# Patient Record
Sex: Female | Born: 1960
Health system: Southern US, Community
[De-identification: ages and names within clinical notes are randomized; demographics above are authoritative.]

## PROBLEM LIST (undated history)

## (undated) DIAGNOSIS — K900 Celiac disease: Secondary | ICD-10-CM

## (undated) DIAGNOSIS — E039 Hypothyroidism, unspecified: Secondary | ICD-10-CM

## (undated) DIAGNOSIS — D649 Anemia, unspecified: Secondary | ICD-10-CM

## (undated) DIAGNOSIS — E079 Disorder of thyroid, unspecified: Secondary | ICD-10-CM

## (undated) DIAGNOSIS — R011 Cardiac murmur, unspecified: Secondary | ICD-10-CM

## (undated) DIAGNOSIS — Z8489 Family history of other specified conditions: Secondary | ICD-10-CM

## (undated) HISTORY — PX: TUBAL LIGATION: SHX77

## (undated) HISTORY — PX: APPENDECTOMY: SHX54

## (undated) HISTORY — DX: Cardiac murmur, unspecified: R01.1

## (undated) HISTORY — DX: Celiac disease: K90.0

## (undated) HISTORY — DX: Disorder of thyroid, unspecified: E07.9

## (undated) HISTORY — PX: HERNIA REPAIR: SHX51

---

## 1998-09-24 ENCOUNTER — Ambulatory Visit (HOSPITAL_BASED_OUTPATIENT_CLINIC_OR_DEPARTMENT_OTHER): Admission: RE | Admit: 1998-09-24 | Discharge: 1998-09-24 | Payer: Self-pay | Admitting: Surgery

## 1999-01-27 ENCOUNTER — Ambulatory Visit (HOSPITAL_COMMUNITY): Admission: RE | Admit: 1999-01-27 | Discharge: 1999-01-27 | Payer: Self-pay | Admitting: Emergency Medicine

## 1999-01-27 ENCOUNTER — Encounter: Payer: Self-pay | Admitting: Emergency Medicine

## 2000-06-13 ENCOUNTER — Ambulatory Visit (HOSPITAL_COMMUNITY): Admission: RE | Admit: 2000-06-13 | Discharge: 2000-06-13 | Payer: Self-pay | Admitting: Emergency Medicine

## 2000-06-13 ENCOUNTER — Encounter: Payer: Self-pay | Admitting: Emergency Medicine

## 2000-08-23 ENCOUNTER — Ambulatory Visit (HOSPITAL_COMMUNITY): Admission: RE | Admit: 2000-08-23 | Discharge: 2000-08-23 | Payer: Self-pay | Admitting: Gastroenterology

## 2001-03-13 ENCOUNTER — Encounter: Payer: Self-pay | Admitting: Emergency Medicine

## 2001-03-13 ENCOUNTER — Emergency Department (HOSPITAL_COMMUNITY): Admission: EM | Admit: 2001-03-13 | Discharge: 2001-03-13 | Payer: Self-pay | Admitting: Emergency Medicine

## 2001-06-19 ENCOUNTER — Ambulatory Visit (HOSPITAL_COMMUNITY): Admission: RE | Admit: 2001-06-19 | Discharge: 2001-06-19 | Payer: Self-pay | Admitting: Emergency Medicine

## 2001-06-19 ENCOUNTER — Encounter: Payer: Self-pay | Admitting: Emergency Medicine

## 2002-06-26 ENCOUNTER — Encounter: Payer: Self-pay | Admitting: Emergency Medicine

## 2002-06-26 ENCOUNTER — Ambulatory Visit (HOSPITAL_COMMUNITY): Admission: RE | Admit: 2002-06-26 | Discharge: 2002-06-26 | Payer: Self-pay | Admitting: Emergency Medicine

## 2002-08-15 ENCOUNTER — Other Ambulatory Visit: Admission: RE | Admit: 2002-08-15 | Discharge: 2002-08-15 | Payer: Self-pay | Admitting: Obstetrics and Gynecology

## 2003-07-04 ENCOUNTER — Ambulatory Visit (HOSPITAL_COMMUNITY): Admission: RE | Admit: 2003-07-04 | Discharge: 2003-07-04 | Payer: Self-pay | Admitting: Obstetrics and Gynecology

## 2004-08-13 ENCOUNTER — Ambulatory Visit (HOSPITAL_COMMUNITY): Admission: RE | Admit: 2004-08-13 | Discharge: 2004-08-13 | Payer: Self-pay | Admitting: Family Medicine

## 2007-01-05 ENCOUNTER — Ambulatory Visit (HOSPITAL_COMMUNITY): Admission: RE | Admit: 2007-01-05 | Discharge: 2007-01-05 | Payer: Self-pay | Admitting: Family Medicine

## 2008-01-28 ENCOUNTER — Ambulatory Visit: Payer: Self-pay | Admitting: Sports Medicine

## 2008-01-28 DIAGNOSIS — M25569 Pain in unspecified knee: Secondary | ICD-10-CM | POA: Insufficient documentation

## 2008-02-28 ENCOUNTER — Ambulatory Visit (HOSPITAL_COMMUNITY): Admission: RE | Admit: 2008-02-28 | Discharge: 2008-02-28 | Payer: Self-pay | Admitting: Family Medicine

## 2008-03-13 ENCOUNTER — Ambulatory Visit: Payer: Self-pay | Admitting: Sports Medicine

## 2008-04-11 ENCOUNTER — Telehealth: Payer: Self-pay | Admitting: Sports Medicine

## 2008-12-30 ENCOUNTER — Ambulatory Visit: Payer: Self-pay | Admitting: Sports Medicine

## 2008-12-30 DIAGNOSIS — M25559 Pain in unspecified hip: Secondary | ICD-10-CM | POA: Insufficient documentation

## 2008-12-30 DIAGNOSIS — S76311A Strain of muscle, fascia and tendon of the posterior muscle group at thigh level, right thigh, initial encounter: Secondary | ICD-10-CM | POA: Insufficient documentation

## 2009-02-10 ENCOUNTER — Ambulatory Visit: Payer: Self-pay | Admitting: Sports Medicine

## 2009-02-10 DIAGNOSIS — R269 Unspecified abnormalities of gait and mobility: Secondary | ICD-10-CM | POA: Insufficient documentation

## 2010-05-25 NOTE — Assessment & Plan Note (Signed)
Summary: FU VISIT/MJD   Vital Signs:  Patient profile:   50 year old female BP sitting:   90 / 60  Vitals Entered By: Lillia Pauls CMA (February 10, 2009 9:28 AM)  History of Present Illness: Patient presents for follow-up of left hamstring pull, proximally. Overall, she is 80% better and is able to tolerate jogging 3-4 times per week. She is now running about 30-35 miles per week. She feels tight at the beginning of her run but then loosens up and feels better. She is wearing the hamstring sleeve but it has recently started falling down since the weather has gotten colder. If she sweats more, it stays in place. She is also wearing the [place] strap on the left which has been helpful. She is doing the home exercises comfortably and feels as if they are helping her strength.   Physical Exam  General:  alert, well-developed, and well-nourished.   Head:  normocephalic and atraumatic.   Msk:  Left Thigh: Normal inspection without bony abnormalities, edema or bruising. She has full ROM of her left hip with internal and external rotation.  She has full ROM of her left knee. No TTP throughout her lateral left hip, groin or anterior thigh. + TTP at proximal mid hamstring with deep palpation only She has pain with resisted knee flexion in the prone position at 90 degrees and more so at 170 degrees which causes some pain. 5/5 strength with resisted hip flexion and abduction. 5/5 strength with resisted knee flexion and extension.  Right Thigh: Normal inspection without bony abnormalities, edema or bruising. She has full ROM of her left hip with internal and external rotation.  She has full ROM of her right knee. No TTP throughout her lateral right hip, groin or anterior or posterior thigh. No pain with resisted knee flexion in the prone position at 90 degrees or at 170 degrees. 5/5 strength with resisted hip flexion and abduction. 5/5 strength with resisted knee flexion and extension.  She still  has an antalgic gait with stiffness but this improves the more that she jogs in the office. She has medial left knee wobble with jogging.    Impression & Recommendations:  Problem # 1:  MUSCLE STRAIN, HAMSTRING MUSCLE (ICD-844.8) Assessment Improved Improving slowly. Reassured her that this will continue to improve with time. 1. Continue home exercise program but may gradually decrease exercises to 3 times per week. 2. Given scaphoid pad in her left shoe to prevent the knee wobble noticed during her gait analysis. 3. Will get the patient a small omniskin hamstring sleeve that causes less sweating for her to try. 4. Follow-up as needed. 5. Work on form drills.  Problem # 2:  ABNORMALITY OF GAIT (ICD-781.2) Assessment: New Still having an antalgic gait from her hamstring with left knee wobble. 1. Placed in a small scaphoid pad on the left to decrease medial knee wobble. 2. Continue exercises for maintenance.  3. Form drills to improve gait.   Patient Instructions: 1)  Continue your hamstring exercises at least 3 times per day. 2)  Work on [employer] of your gait. You still have a bit of a wobble of your inner knee but you should get better with form drills and by wearing the arch support in your left leg. Form drills 2 times per week : 10 x 75 yards. 3)  No speed drills until your form has improved and you feel smooth. 4)  Avoid going down hills because of overstriding. 5)  Please contact us next week about the new hamstring sleeve. 6)  You had a small scaphoid pad placed in your left shoe.   Appended Document: FU VISIT/MJD

## 2010-05-25 NOTE — Progress Notes (Signed)
Summary: knee pain  Phone Note Call from Patient Call back at [phone]   Caller: Patient Summary of Call: Patient says she has been experiencing some pain in her knee.  Wants to start training but not sure if she should.  Would like return call to discuss whether or not she needs to come back in to see Dr. Darrick Penna. Initial call taken by: Levada Schilling,  April 11, 2008 9:19 AM    I called and we will modify and grad increase training

## 2010-05-25 NOTE — Assessment & Plan Note (Signed)
Summary: L HAMSTRING INJURY X JUNE   Vital Signs:  Patient profile:   50 year old female Height:      66 inches Weight:      125 pounds BMI:     20.25 BP sitting:   110 / 60  Vitals Entered By: Lillia Pauls CMA (December 30, 2008 10:40 AM)  History of Present Illness: The patient is a 50 yo WF who presents for left hamstring pain. She is an avid runner who is currently training for the [employer]. 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 She states that back in June she ran about 5, 800s with her son who is 18 at the track. She does not remember a specific injury or pop but knows that she increased her intensity on her last sprint. Ever since that day, she has noticed an aching pain in the mid posterior thigh that radiates up towards her buttocks. She typically describes an aching sensation that gets better after running about 2 miles. However, the pain is exacerbated by sitting for long periods of time or running down hills. She is now avoiding running down hills but admits that the [employer] on 01/25/2009 has many hills. She would like to be ready for this race if possible but is more concerned about [place]. She has tried icing and ibuprofen with minimal improvement. She usually runs about 45 miles per week but is down to 30 miles because of her injury.   Physical Exam  General:  alert, well-developed, well-nourished, and well-hydrated.   Head:  normocephalic and atraumatic.   Msk:  Inspection of bilateral legs shows no bony abnormalities, bruising or edema. She has TTP in the mid posterior thigh along her semitendinous but not at the ischial tuberosity.  Fluid is mild around this muscle. No gap or tears are appreciated on palpation.  She has pain with resisted knee flexion at the semitendinous at 30 degrees and less pain at 90 degrees.   Normal internal and external rotation of bilateral hips. Good left hip abductor strength but weaker right hip abductor. Normal  inspection, palpation and ROM of bilateral knees.  Gait is antalgic with bilateral intoeing, left greater than right. She has horizontal motion in bilateral knees.   Impression & Recommendations:  Problem # 1:  MUSCLE STRAIN, HAMSTRING MUSCLE (ICD-844.8) Assessment New  Injury occurred on or about June 10th, 2010.   Wear thigh sleeve regularly during activity. Ice thigh for 20 minutes after activity. Avoid downhill runs. Start hamstring rehab exercises, phase 1. Do these at least daily.  Modify running so that she does not run to the point of hurting. It is unlikely that she will be ready for the marathon 01/25/2009 but she can make that judgement based on her symptoms.  Problem # 2:  PAIN IN JOINT PELVIC REGION AND THIGH (ICD-719.45) Assessment: New  See above. Can take an OTC NSAID of choice as needed for pain. Regular icing and activity modification.

## 2010-06-22 ENCOUNTER — Other Ambulatory Visit (HOSPITAL_COMMUNITY): Payer: Self-pay | Admitting: Family Medicine

## 2010-06-22 DIAGNOSIS — Z1231 Encounter for screening mammogram for malignant neoplasm of breast: Secondary | ICD-10-CM

## 2010-06-30 ENCOUNTER — Ambulatory Visit (HOSPITAL_COMMUNITY)
Admission: RE | Admit: 2010-06-30 | Discharge: 2010-06-30 | Disposition: A | Discharge status: Home / Self Care | Payer: 59 | Source: Ambulatory Visit | Attending: Family Medicine | Admitting: Family Medicine

## 2010-06-30 DIAGNOSIS — Z1231 Encounter for screening mammogram for malignant neoplasm of breast: Secondary | ICD-10-CM | POA: Insufficient documentation

## 2010-09-10 NOTE — Op Note (Signed)
Colleen Orr. [facility]  Patient:    Colleen Orr, Colleen Orr                    MRN: [id number] Proc. Date: 08/23/00 Adm. Date:  [id number] Attending:  Charna Elizabeth CC:         [name], MD   Operative Report  DATE OF BIRTH:  Oct 15, 1960.  REFERRING PHYSICIAN:  Dr. Lesle Chris.  PROCEDURE PERFORMED:  Colonoscopy.  ENDOSCOPIST:  Anselmo Rod, M.D.  INSTRUMENT USED:  Olympus video colonoscope.  INDICATIONS FOR PROCEDURE:  Rectal bleeding in a 50 year old white female with a history of diarrhea in the recent past, rule out colonic polyps, masses, hemorrhoids, etc.  PREPROCEDURE PREPARATION:  Informed consent was procured from the patient. The patient was fasted for eight hours prior to the procedure and prepped with a bottle of magnesium citrate and a gallon of NuLytely the night prior to the procedure.  PREPROCEDURE PHYSICAL:  The patient had stable vital signs.  Neck supple. Chest clear to auscultation.  S1, S2 regular.  No murmurs, gallops or rubs, rales, rhonchi or wheezing.  Abdomen soft with normal abdominal bowel sounds.  DESCRIPTION OF PROCEDURE:  The patient was placed in the left lateral decubitus position and sedated with 75 mg of Demerol and 7.5 mg of Versed intravenously.  Once the patient was adequately sedated and maintained on low-flow oxygen and continuous cardiac monitoring, the Olympus video colonoscope was advanced from the rectum to the cecum without difficulty. Except for small internal hemorrhoids, no other abnormalities were noted.  No masses, polyps, erosions, ulcerations were present.  IMPRESSION:  Normal colonoscopy except for small internal hemorrhoids.  RECOMMENDATIONS: 1. The patient has been advised to follow up in the office in the next    two weeks. 2. A high fiber diet and liberal fluid intake has been advocated to help with    her irritable bowel syndrome-like symptoms. 3. Repeat hepatitis serologies will be  done and a work up continued on an    outpatient basis. DD:  08/23/00 TD:  08/23/00 Job: [id number]

## 2011-07-25 ENCOUNTER — Other Ambulatory Visit: Payer: Self-pay | Admitting: Family Medicine

## 2011-07-25 DIAGNOSIS — Z1231 Encounter for screening mammogram for malignant neoplasm of breast: Secondary | ICD-10-CM

## 2011-08-05 ENCOUNTER — Ambulatory Visit (HOSPITAL_COMMUNITY)
Admission: RE | Admit: 2011-08-05 | Discharge: 2011-08-05 | Disposition: A | Discharge status: Home / Self Care | Payer: 59 | Source: Ambulatory Visit | Attending: Family Medicine | Admitting: Family Medicine

## 2011-08-05 DIAGNOSIS — Z1231 Encounter for screening mammogram for malignant neoplasm of breast: Secondary | ICD-10-CM

## 2012-08-14 ENCOUNTER — Other Ambulatory Visit (HOSPITAL_COMMUNITY): Payer: Self-pay | Admitting: Family Medicine

## 2012-08-14 DIAGNOSIS — Z1231 Encounter for screening mammogram for malignant neoplasm of breast: Secondary | ICD-10-CM

## 2012-08-16 ENCOUNTER — Ambulatory Visit (HOSPITAL_COMMUNITY)
Admission: RE | Admit: 2012-08-16 | Discharge: 2012-08-16 | Disposition: A | Discharge status: Home / Self Care | Payer: 59 | Source: Ambulatory Visit | Attending: Family Medicine | Admitting: Family Medicine

## 2012-08-16 DIAGNOSIS — Z1231 Encounter for screening mammogram for malignant neoplasm of breast: Secondary | ICD-10-CM | POA: Insufficient documentation

## 2012-11-15 ENCOUNTER — Other Ambulatory Visit (HOSPITAL_BASED_OUTPATIENT_CLINIC_OR_DEPARTMENT_OTHER): Payer: Self-pay | Admitting: Family Medicine

## 2012-11-15 DIAGNOSIS — E039 Hypothyroidism, unspecified: Secondary | ICD-10-CM

## 2012-11-19 ENCOUNTER — Ambulatory Visit (HOSPITAL_BASED_OUTPATIENT_CLINIC_OR_DEPARTMENT_OTHER): Payer: 59

## 2012-11-20 ENCOUNTER — Ambulatory Visit (HOSPITAL_BASED_OUTPATIENT_CLINIC_OR_DEPARTMENT_OTHER)
Admission: RE | Admit: 2012-11-20 | Discharge: 2012-11-20 | Disposition: A | Discharge status: Home / Self Care | Payer: 59 | Source: Ambulatory Visit | Attending: Family Medicine | Admitting: Family Medicine

## 2012-11-20 DIAGNOSIS — E039 Hypothyroidism, unspecified: Secondary | ICD-10-CM | POA: Insufficient documentation

## 2012-11-20 DIAGNOSIS — E041 Nontoxic single thyroid nodule: Secondary | ICD-10-CM | POA: Insufficient documentation

## 2013-09-11 ENCOUNTER — Other Ambulatory Visit (HOSPITAL_COMMUNITY): Payer: Self-pay | Admitting: Family Medicine

## 2013-09-11 DIAGNOSIS — Z1231 Encounter for screening mammogram for malignant neoplasm of breast: Secondary | ICD-10-CM

## 2013-09-23 ENCOUNTER — Ambulatory Visit (HOSPITAL_COMMUNITY)
Admission: RE | Admit: 2013-09-23 | Discharge: 2013-09-23 | Disposition: A | Discharge status: Home / Self Care | Payer: 59 | Source: Ambulatory Visit | Attending: Family Medicine | Admitting: Family Medicine

## 2013-09-23 DIAGNOSIS — Z1231 Encounter for screening mammogram for malignant neoplasm of breast: Secondary | ICD-10-CM | POA: Insufficient documentation

## 2014-01-08 ENCOUNTER — Other Ambulatory Visit (HOSPITAL_COMMUNITY)
Admission: RE | Admit: 2014-01-08 | Discharge: 2014-01-08 | Disposition: A | Discharge status: Home / Self Care | Payer: 59 | Source: Ambulatory Visit | Attending: Family Medicine | Admitting: Family Medicine

## 2014-01-08 ENCOUNTER — Other Ambulatory Visit: Payer: Self-pay

## 2014-01-08 DIAGNOSIS — Z124 Encounter for screening for malignant neoplasm of cervix: Secondary | ICD-10-CM | POA: Diagnosis not present

## 2014-01-13 LAB — CYTOLOGY - PAP

## 2014-02-05 ENCOUNTER — Ambulatory Visit (INDEPENDENT_AMBULATORY_CARE_PROVIDER_SITE_OTHER): Payer: 59 | Admitting: Family Medicine

## 2014-02-05 ENCOUNTER — Encounter: Payer: Self-pay | Admitting: Family Medicine

## 2014-02-05 VITALS — BP 134/79 | HR 56 | Ht 66.0 in | Wt 130.0 lb

## 2014-02-05 DIAGNOSIS — M25552 Pain in left hip: Secondary | ICD-10-CM

## 2014-02-05 NOTE — Patient Instructions (Signed)
You have a hip flexor strain and/or snapping hip syndrome. These conditions go hand in hand and treatment is the same. Activities as tolerated - ok to run as long as not limping and pain is less than a 3 on a scale of 1-10. Ibuprofen or aleve if needed for pain. Strengthening and stretching are very important. Straight leg raises, standing hip rotations 3 sets of 10 once a day.   Stretches - hold for 20-30 seconds, repeat 3 times. Can do additional exercises and stretches in the handout as well. Consider physical therapy if not improving as expected. I would expect 4-6 weeks for this to feel completely better. Heat 15 minutes at a time 3-4 times a day with icing 15 minutes after you work out. Follow up with me in 5-6 weeks if not improved.

## 2014-02-07 ENCOUNTER — Encounter: Payer: Self-pay | Admitting: Family Medicine

## 2014-02-07 DIAGNOSIS — M25552 Pain in left hip: Secondary | ICD-10-CM | POA: Insufficient documentation

## 2014-02-07 NOTE — Progress Notes (Signed)
Patient ID: Colleen HarvestPhyllis A Orr, female   DOB: 1960/06/16, 53 y.o.   MRN: [id number]  PCP: Colleen RuaMEYERS, STEPHEN, MD  Subjective:   HPI: Patient is a 53 y.o. female here for left hip pain.  Patient reports she started getting left hip pain about 2 weeks ago. Has progressed since that time. Was better with rest. Worse after she used treadmill 4 days in a row. No injury or trauma. Hip sore at end of 6 mile run this morning. No swelling or bruising. Achy pain. Tried icing, motrin. Worse with hip flexion.  Past Medical History  Diagnosis Date  . Celiac disease   . Thyroid disease     No current outpatient prescriptions on file prior to visit.   No current facility-administered medications on file prior to visit.    Past Surgical History  Procedure Laterality Date  . Hernia repair    . Tubal ligation    . Appendectomy      No Known Allergies  History   Social History  . Marital Status: Married    Spouse Name: N/A    Number of Children: N/A  . Years of Education: N/A   Occupational History  . Not on file.   Social History Main Topics  . Smoking status: Never Smoker   . Smokeless tobacco: Not on file  . Alcohol Use: Not on file  . Drug Use: Not on file  . Sexual Activity: Not on file   Other Topics Concern  . Not on file   Social History Narrative  . No narrative on file    No family history on file.  BP 134/79  Pulse 56  Ht 5\' 6"  (1.676 m)  Wt 130 lb (58.968 kg)  BMI 20.99 kg/m2  Review of Systems: See HPI above.    Objective:  Physical Exam:  Gen: NAD  Back/L hip: No gross deformity, scoliosis. No trochanter, other hip or back TTP .  No midline or bony TTP. FROM with pain on hip flexion. Strength LEs 5/5 all muscle groups but pain testing hip flexion.   2+ MSRs in patellar and achilles tendons, equal bilaterally. Negative SLRs. Sensation intact to light touch bilaterally. Negative logroll bilateral hips Negative fabers and piriformis  stretches. Negative hop test.    Assessment & Plan:  1. Left hip pain - 2/2 hip flexor strain or snapping hip syndrome.  Exam not consistent with a stress fracture.  Start with home exercise program which was reviewed today.  Icing, nsaids if needed.  Activities as tolerated.  Consider physical therapy if not improving as expected.  Heat for spasms, ice after exercise.  F/u in 5-6 weeks if not improved.

## 2014-02-07 NOTE — Assessment & Plan Note (Signed)
2/2 hip flexor strain or snapping hip syndrome.  Exam not consistent with a stress fracture.  Start with home exercise program which was reviewed today.  Icing, nsaids if needed.  Activities as tolerated.  Consider physical therapy if not improving as expected.  Heat for spasms, ice after exercise.  F/u in 5-6 weeks if not improved.

## 2014-03-05 ENCOUNTER — Ambulatory Visit: Payer: 59 | Admitting: Sports Medicine

## 2014-10-09 ENCOUNTER — Other Ambulatory Visit (HOSPITAL_COMMUNITY): Payer: Self-pay | Admitting: Family Medicine

## 2014-10-09 DIAGNOSIS — Z1231 Encounter for screening mammogram for malignant neoplasm of breast: Secondary | ICD-10-CM

## 2014-10-14 ENCOUNTER — Ambulatory Visit (HOSPITAL_COMMUNITY)
Admission: RE | Admit: 2014-10-14 | Discharge: 2014-10-14 | Disposition: A | Discharge status: Home / Self Care | Payer: 59 | Source: Ambulatory Visit | Attending: Family Medicine | Admitting: Family Medicine

## 2014-10-14 DIAGNOSIS — Z1231 Encounter for screening mammogram for malignant neoplasm of breast: Secondary | ICD-10-CM | POA: Diagnosis not present

## 2015-10-08 ENCOUNTER — Other Ambulatory Visit: Payer: Self-pay | Admitting: Physician Assistant

## 2015-10-08 ENCOUNTER — Other Ambulatory Visit (HOSPITAL_BASED_OUTPATIENT_CLINIC_OR_DEPARTMENT_OTHER): Payer: Self-pay | Admitting: Physician Assistant

## 2015-10-08 DIAGNOSIS — Z1231 Encounter for screening mammogram for malignant neoplasm of breast: Secondary | ICD-10-CM

## 2015-10-22 ENCOUNTER — Ambulatory Visit (HOSPITAL_BASED_OUTPATIENT_CLINIC_OR_DEPARTMENT_OTHER)
Admission: RE | Admit: 2015-10-22 | Discharge: 2015-10-22 | Disposition: A | Discharge status: Home / Self Care | Payer: 59 | Source: Ambulatory Visit | Attending: Physician Assistant | Admitting: Physician Assistant

## 2015-10-22 DIAGNOSIS — Z1231 Encounter for screening mammogram for malignant neoplasm of breast: Secondary | ICD-10-CM

## 2016-02-16 ENCOUNTER — Ambulatory Visit (INDEPENDENT_AMBULATORY_CARE_PROVIDER_SITE_OTHER): Payer: 59 | Admitting: Sports Medicine

## 2016-02-16 DIAGNOSIS — S76311A Strain of muscle, fascia and tendon of the posterior muscle group at thigh level, right thigh, initial encounter: Secondary | ICD-10-CM | POA: Diagnosis not present

## 2016-02-16 NOTE — Progress Notes (Signed)
   Subjective:    Patient ID: Colleen Orr, female    DOB: Dec 14, 1960, 55 y.o.   MRN: [id number]  HPI   Colleen Orr is 55 y.o. female who presents with right hamstring pain. Pain is located where the top of her right thigh meets her buttocks. Pain began 2 months ago. She believes she might have overstretched her hamstring. Pain occurs with running and sometimes with prolonged sitting. She is able to do the bike, elliptical and walk without pain. She has tried a couple visits to the chiropractor without much relief. Denies lateral hip pain. Pain occasionally radiates down her thigh muscle.   Review of Systems: Sensation of right sided lower extremity weakness with running. No LBP No sciatica     Objective:   Physical Exam Blood pressure 115/62, height 5\' 6"  (1.676 m), weight 128 lb (58.1 kg). Pleasant female in NAD.   TTP over the right ischial tuberosity. Good ROM at hips and knees. Hamstring strength equal and preserved bilaterally. Hip abduction strength lessened on right compared to left.   Runs with a limp. Limp improved with right heel lift.     Assessment & Plan:   Right Hamstring Pain: High hamstring injury. Suspect secondary to weak hip abductors on the right side predisposing her to hamstring injury.  -hip and hamstring series exercises  -right heel lift  Cont running in compression pants -running as tolerated -f/u in 6 weeks

## 2016-02-16 NOTE — Assessment & Plan Note (Signed)
See OV note  We will focus on HEP Emphasize getting hip strength to normal on RT Work HS series  Reck in 2 mos

## 2016-03-30 ENCOUNTER — Ambulatory Visit: Payer: 59 | Admitting: Sports Medicine

## 2016-09-26 ENCOUNTER — Other Ambulatory Visit (HOSPITAL_BASED_OUTPATIENT_CLINIC_OR_DEPARTMENT_OTHER): Payer: Self-pay | Admitting: Physician Assistant

## 2016-09-26 DIAGNOSIS — Z1231 Encounter for screening mammogram for malignant neoplasm of breast: Secondary | ICD-10-CM

## 2016-10-25 ENCOUNTER — Encounter (HOSPITAL_BASED_OUTPATIENT_CLINIC_OR_DEPARTMENT_OTHER): Payer: Self-pay

## 2016-10-25 ENCOUNTER — Ambulatory Visit (HOSPITAL_BASED_OUTPATIENT_CLINIC_OR_DEPARTMENT_OTHER)
Admission: RE | Admit: 2016-10-25 | Discharge: 2016-10-25 | Disposition: A | Discharge status: Home / Self Care | Payer: 59 | Source: Ambulatory Visit | Attending: Physician Assistant | Admitting: Physician Assistant

## 2016-10-25 DIAGNOSIS — Z1231 Encounter for screening mammogram for malignant neoplasm of breast: Secondary | ICD-10-CM | POA: Diagnosis not present

## 2017-01-16 DIAGNOSIS — Z23 Encounter for immunization: Secondary | ICD-10-CM | POA: Diagnosis not present

## 2017-01-16 DIAGNOSIS — E039 Hypothyroidism, unspecified: Secondary | ICD-10-CM | POA: Diagnosis not present

## 2017-01-16 DIAGNOSIS — E7800 Pure hypercholesterolemia, unspecified: Secondary | ICD-10-CM | POA: Diagnosis not present

## 2017-01-16 DIAGNOSIS — Z0000 Encounter for general adult medical examination without abnormal findings: Secondary | ICD-10-CM | POA: Diagnosis not present

## 2017-01-16 DIAGNOSIS — Z130 Encounter for screening for diseases of the blood and blood-forming organs and certain disorders involving the immune mechanism: Secondary | ICD-10-CM | POA: Diagnosis not present

## 2017-01-16 DIAGNOSIS — Z131 Encounter for screening for diabetes mellitus: Secondary | ICD-10-CM | POA: Diagnosis not present

## 2017-01-16 DIAGNOSIS — E559 Vitamin D deficiency, unspecified: Secondary | ICD-10-CM | POA: Diagnosis not present

## 2017-03-09 DIAGNOSIS — H524 Presbyopia: Secondary | ICD-10-CM | POA: Diagnosis not present

## 2017-03-09 DIAGNOSIS — H52223 Regular astigmatism, bilateral: Secondary | ICD-10-CM | POA: Diagnosis not present

## 2017-03-09 DIAGNOSIS — H5203 Hypermetropia, bilateral: Secondary | ICD-10-CM | POA: Diagnosis not present

## 2017-05-30 ENCOUNTER — Ambulatory Visit (INDEPENDENT_AMBULATORY_CARE_PROVIDER_SITE_OTHER): Payer: 59 | Admitting: Sports Medicine

## 2017-05-30 ENCOUNTER — Encounter: Payer: Self-pay | Admitting: Sports Medicine

## 2017-05-30 DIAGNOSIS — S76312A Strain of muscle, fascia and tendon of the posterior muscle group at thigh level, left thigh, initial encounter: Secondary | ICD-10-CM | POA: Diagnosis not present

## 2017-05-30 NOTE — Progress Notes (Signed)
   Subjective:    Patient ID: Colleen HarvestPhyllis A Orr, female    DOB: 04-26-1960, 57 y.o.   MRN: [id number]  HPI  Colleen Orr is a 57 yo long-distance running presenting with L hamstring pain. Began on 01/15 when running on hills. [facility], and had increased mileage during the week prior to symptom onset. Describes the pain as located "deep", within her R posterior thigh and buttocks. Pain is worst when running downhill. Had similar pain in 2017 on R, but says she was able to run through that pain. This pain is more severe, and she has not been able to run more than 4 miles since onset. Has been using ice, heat, and Aleve with no improvement in symptoms. Also recently had massage which improved symptoms somewhat temporarily. Has continued to do hamstring and hip exercises that she was given at appt in 2017 at least 3 times per week. Was also given compression sleeve at that time which she has not been wearing as she felt it was not particularly effective.    Review of Systems MSK: Denies numbness, tingling, shooting pains in lower extremities.     Objective:   Physical Exam  Constitutional: She is oriented to person, place, and time. She appears well-developed and well-nourished. No distress.  Musculoskeletal:  Weakness of L biceps femoris on strength testing. Otherwise LLE with normal strength. No abnormalities of RLE.  Significant supination of L foot when running.   Neurological: She is alert and oriented to person, place, and time.  Psychiatric: She has a normal mood and affect. Her behavior is normal.      Assessment & Plan:  Hamstring strain, left, initial encounter With weakness of biceps femoris on exam. Likely secondary to overuse while training [facility], though as patient with history of R hamstring injury two years ago, concern for possible lumbar/sciatic etiology. No tears noted on ultrasound, however some calcifications  suggested healed microtears noted. Has been performing hip and hamstring strengthening exercises regularly but still has weakness in biceps femoris as mentioned above. As such, will begin hamstring curls with ankle weights, as well as running backwards and running forwards with overextension in addition to current hip/hamstring exercise regimen. Also encouraged patient to wear compression sleeve and take ibuprofen/Aleve PRN. If improvement in symptoms, can resume running gradually after 2 weeks. F/u in 6 weeks to ensure symptoms are improving. If not, may need to further investigate possible lumbar/sciatic involvement, and may require gabapentin or other agent for neuropathic pain.   Tarri AbernethyAbigail J Lancaster, MD, MPH PGY-3 [facility] Pager [phone]  I observed and examined the patient with the resident and agree with assessment and plan.  Note reviewed and modified by me. Enid BaasKarl Jayani Rozman, MD

## 2017-05-30 NOTE — Assessment & Plan Note (Signed)
With weakness of biceps femoris on exam. Likely secondary to overuse while training [facility], though as patient with history of R hamstring injury two years ago, concern for possible lumbar/sciatic etiology. No tears noted on ultrasound, however some calcifications suggested healed microtears noted. Has been performing hip and hamstring strengthening exercises regularly but still has weakness in biceps femoris as mentioned above. As such, will begin hamstring curls with ankle weights, as well as running backwards and running forwards with overextension in addition to current hip/hamstring exercise regimen. Also encouraged patient to wear compression sleeve and take ibuprofen/Aleve PRN. If improvement in symptoms, can resume running gradually after 2 weeks. F/u in 6 weeks to ensure symptoms are improving. If not, may need to further investigate possible lumbar/sciatic involvement, and may require gabapentin or other agent for neuropathic pain.

## 2017-05-30 NOTE — Patient Instructions (Signed)
It was nice meeting you today Colleen Orr!  Please continue to do the exercises you have already been doing at home, in addition to the following: - Running backwards (50 yards, repeat 10 times) - Overstepping (50 yards, repeat 10 times) - Hamstring curls with an ankle weight, making sure to turn your foot out (3 sets of 15)  In addition to this, be sure to wear your compression sleeve. You can also take ibuprofen or Aleve as needed for pain.   If your symptoms seem to be improving, you can gradually return to running in about two weeks.   We will see you back in 6 weeks for follow-up.   If you have any questions or concerns, please feel free to call the clinic.   Be well,  Dr. Natale MilchLancaster

## 2017-07-11 ENCOUNTER — Ambulatory Visit: Payer: 59 | Admitting: Sports Medicine

## 2017-07-14 DIAGNOSIS — E039 Hypothyroidism, unspecified: Secondary | ICD-10-CM | POA: Diagnosis not present

## 2017-09-13 DIAGNOSIS — Z8249 Family history of ischemic heart disease and other diseases of the circulatory system: Secondary | ICD-10-CM | POA: Diagnosis not present

## 2017-09-13 DIAGNOSIS — R2241 Localized swelling, mass and lump, right lower limb: Secondary | ICD-10-CM | POA: Diagnosis not present

## 2017-09-13 DIAGNOSIS — M79604 Pain in right leg: Secondary | ICD-10-CM | POA: Diagnosis not present

## 2017-09-13 DIAGNOSIS — M7989 Other specified soft tissue disorders: Secondary | ICD-10-CM | POA: Diagnosis not present

## 2017-09-20 ENCOUNTER — Ambulatory Visit (INDEPENDENT_AMBULATORY_CARE_PROVIDER_SITE_OTHER): Payer: 59 | Admitting: Family Medicine

## 2017-09-20 DIAGNOSIS — S8991XA Unspecified injury of right lower leg, initial encounter: Secondary | ICD-10-CM | POA: Diagnosis not present

## 2017-09-20 MED ORDER — CEPHALEXIN 500 MG PO CAPS: 500.0000 mg | ORAL_CAPSULE | Freq: Four times a day (QID) | ORAL | 0 refills | Status: DC

## 2017-09-20 NOTE — Patient Instructions (Signed)
You have a hematoma and tibial contusion of your lower leg. Take the keflex 4 times a day for 7 days to cover for your mild cellulitis. Finish this out even if you feel better. Icing 15 minutes at a time 3-4 times a day. Compression as often as possible during the day. Elevate above your heart when possible to help with the swelling. Exercise, running is as tolerated. Call us if you have any problems, redness spreads as we discussed, temperature over 100.4 otherwise follow up as needed.

## 2017-09-24 ENCOUNTER — Encounter: Payer: Self-pay | Admitting: Family Medicine

## 2017-09-24 DIAGNOSIS — S8991XA Unspecified injury of right lower leg, initial encounter: Secondary | ICD-10-CM | POA: Insufficient documentation

## 2017-09-24 NOTE — Assessment & Plan Note (Signed)
radiographs, doppler u/s negative at outside facility.  Exam and ultrasound reassuring today.  Consistent with hematoma and tibial contusion.  She has some surrounding erythema concerning for early cellulitis.  Start keflex.  Icing, compression, elevation.  Exercise, running as tolerated.  Call us with any problems, spreading erythema, fever otherwise f/u prn.

## 2017-09-24 NOTE — Progress Notes (Signed)
PCP: Joycelyn Rua, MD  Subjective:   HPI: Patient is a 57 y.o. female here for right shin pain.  Patient reports a week ago she accidentally fell off a treadmill causing abrasions to left thigh, knees but severe swelling to right shin. She had normal x-rays and doppler ultrasound (happened in [place]). Has been icing, using ACE wrap which help. Some increase in redness in area where she has a small cut anterior shin. Standing is uncomfortable but walking is ok. No numbness.  Past Medical History:  Diagnosis Date  . Celiac disease   . Thyroid disease     Current Outpatient Medications on File Prior to Visit  Medication Sig Dispense Refill  . levothyroxine (SYNTHROID, LEVOTHROID) 88 MCG tablet      No current facility-administered medications on file prior to visit.     Past Surgical History:  Procedure Laterality Date  . APPENDECTOMY    . HERNIA REPAIR    . TUBAL LIGATION      No Known Allergies  Social History   Socioeconomic History  . Marital status: Married    Spouse name: Not on file  . Number of children: Not on file  . Years of education: Not on file  . Highest education level: Not on file  Occupational History  . Not on file  Social Needs  . Financial resource strain: Not on file  . Food insecurity:    Worry: Not on file    Inability: Not on file  . Transportation needs:    Medical: Not on file    Non-medical: Not on file  Tobacco Use  . Smoking status: Never Smoker  Substance and Sexual Activity  . Alcohol use: Not on file  . Drug use: Not on file  . Sexual activity: Not on file  Lifestyle  . Physical activity:    Days per week: Not on file    Minutes per session: Not on file  . Stress: Not on file  Relationships  . Social connections:    Talks on phone: Not on file    Gets together: Not on file    Attends religious service: Not on file    Active member of club or organization: Not on file    Attends meetings of clubs or  organizations: Not on file    Relationship status: Not on file  . Intimate partner violence:    Fear of current or ex partner: Not on file    Emotionally abused: Not on file    Physically abused: Not on file    Forced sexual activity: Not on file  Other Topics Concern  . Not on file  Social History Narrative  . Not on file    History reviewed. No pertinent family history.  BP 118/76   Ht 5\' 6"  (1.676 m)   Wt 130 lb (59 kg)   BMI 20.98 kg/m   Review of Systems: See HPI above.     Objective:  Physical Exam:  Gen: NAD, comfortable in exam room  Right leg: Abrasion right knee.  Moderate amount of swelling over right shin.  Significant bruising lower leg.  Small cut anterior shin with small amount of surrounding erythema. TTP greatest over mid-shin, swollen area.  No other tenderness. FROM knee and ankle with 5/5 strength without pain. NVI distally.  Left leg: Abrasion left knee, thigh but no erythema, drainage.  No other deformity. FROM with 5/5 strength knee. No tenderness to palpation. NVI distally.   MSK u/s:  Hematoma mostly clotted at this point over anterior lower leg.  Tibia normal without cortical irregularities or edema overlying cortex.  Assessment & Plan:  1. Right leg injury - radiographs, doppler u/s negative at outside facility.  Exam and ultrasound reassuring today.  Consistent with hematoma and tibial contusion.  She has some surrounding erythema concerning for early cellulitis.  Start keflex.  Icing, compression, elevation.  Exercise, running as tolerated.  Call us with any problems, spreading erythema, fever otherwise f/u prn.

## 2017-11-10 ENCOUNTER — Other Ambulatory Visit (HOSPITAL_BASED_OUTPATIENT_CLINIC_OR_DEPARTMENT_OTHER): Payer: Self-pay | Admitting: Family Medicine

## 2017-11-10 ENCOUNTER — Other Ambulatory Visit: Payer: Self-pay | Admitting: Family Medicine

## 2017-11-10 DIAGNOSIS — Z1231 Encounter for screening mammogram for malignant neoplasm of breast: Secondary | ICD-10-CM

## 2017-11-15 ENCOUNTER — Ambulatory Visit (HOSPITAL_BASED_OUTPATIENT_CLINIC_OR_DEPARTMENT_OTHER)
Admission: RE | Admit: 2017-11-15 | Discharge: 2017-11-15 | Disposition: A | Discharge status: Home / Self Care | Payer: 59 | Source: Ambulatory Visit | Attending: Family Medicine | Admitting: Family Medicine

## 2017-11-15 DIAGNOSIS — Z1231 Encounter for screening mammogram for malignant neoplasm of breast: Secondary | ICD-10-CM | POA: Diagnosis not present

## 2017-12-26 DIAGNOSIS — Z0000 Encounter for general adult medical examination without abnormal findings: Secondary | ICD-10-CM | POA: Diagnosis not present

## 2017-12-26 DIAGNOSIS — E039 Hypothyroidism, unspecified: Secondary | ICD-10-CM | POA: Diagnosis not present

## 2017-12-29 DIAGNOSIS — H527 Unspecified disorder of refraction: Secondary | ICD-10-CM | POA: Diagnosis not present

## 2018-02-20 DIAGNOSIS — Z23 Encounter for immunization: Secondary | ICD-10-CM | POA: Diagnosis not present

## 2018-04-06 DIAGNOSIS — Z23 Encounter for immunization: Secondary | ICD-10-CM | POA: Diagnosis not present

## 2018-06-07 DIAGNOSIS — Z23 Encounter for immunization: Secondary | ICD-10-CM | POA: Diagnosis not present

## 2019-05-06 ENCOUNTER — Other Ambulatory Visit (HOSPITAL_BASED_OUTPATIENT_CLINIC_OR_DEPARTMENT_OTHER): Payer: Self-pay | Admitting: Family Medicine

## 2019-05-06 DIAGNOSIS — Z1231 Encounter for screening mammogram for malignant neoplasm of breast: Secondary | ICD-10-CM

## 2019-05-09 ENCOUNTER — Ambulatory Visit (HOSPITAL_BASED_OUTPATIENT_CLINIC_OR_DEPARTMENT_OTHER)
Admission: RE | Admit: 2019-05-09 | Discharge: 2019-05-09 | Disposition: A | Discharge status: Home / Self Care | Payer: 59 | Source: Ambulatory Visit | Attending: Family Medicine | Admitting: Family Medicine

## 2019-05-09 ENCOUNTER — Other Ambulatory Visit: Payer: Self-pay

## 2019-05-09 DIAGNOSIS — Z1231 Encounter for screening mammogram for malignant neoplasm of breast: Secondary | ICD-10-CM | POA: Insufficient documentation

## 2019-11-28 DIAGNOSIS — R519 Headache, unspecified: Secondary | ICD-10-CM | POA: Diagnosis not present

## 2019-11-28 DIAGNOSIS — Z20822 Contact with and (suspected) exposure to covid-19: Secondary | ICD-10-CM | POA: Diagnosis not present

## 2020-01-10 DIAGNOSIS — E039 Hypothyroidism, unspecified: Secondary | ICD-10-CM | POA: Diagnosis not present

## 2020-01-10 DIAGNOSIS — Z131 Encounter for screening for diabetes mellitus: Secondary | ICD-10-CM | POA: Diagnosis not present

## 2020-01-10 DIAGNOSIS — Z23 Encounter for immunization: Secondary | ICD-10-CM | POA: Diagnosis not present

## 2020-01-10 DIAGNOSIS — Z124 Encounter for screening for malignant neoplasm of cervix: Secondary | ICD-10-CM | POA: Diagnosis not present

## 2020-01-10 DIAGNOSIS — Z13220 Encounter for screening for lipoid disorders: Secondary | ICD-10-CM | POA: Diagnosis not present

## 2020-01-10 DIAGNOSIS — Z0000 Encounter for general adult medical examination without abnormal findings: Secondary | ICD-10-CM | POA: Diagnosis not present

## 2020-01-22 DIAGNOSIS — J00 Acute nasopharyngitis [common cold]: Secondary | ICD-10-CM | POA: Diagnosis not present

## 2020-01-22 DIAGNOSIS — Z03818 Encounter for observation for suspected exposure to other biological agents ruled out: Secondary | ICD-10-CM | POA: Diagnosis not present

## 2020-03-14 DIAGNOSIS — S42292A Other displaced fracture of upper end of left humerus, initial encounter for closed fracture: Secondary | ICD-10-CM | POA: Diagnosis not present

## 2020-03-16 ENCOUNTER — Ambulatory Visit: Payer: Self-pay

## 2020-03-16 ENCOUNTER — Ambulatory Visit (INDEPENDENT_AMBULATORY_CARE_PROVIDER_SITE_OTHER): Payer: BC Managed Care – PPO | Admitting: Orthopedic Surgery

## 2020-03-16 ENCOUNTER — Telehealth: Payer: Self-pay

## 2020-03-16 DIAGNOSIS — M25512 Pain in left shoulder: Secondary | ICD-10-CM

## 2020-03-16 NOTE — Telephone Encounter (Signed)
Appt scheduled

## 2020-03-16 NOTE — Telephone Encounter (Signed)
Patient called she stated she spoke with Dr.Duda over the weekend and he told her to see if she can be seen today on [facility] schedule. [phone]

## 2020-03-17 ENCOUNTER — Ambulatory Visit
Admission: RE | Admit: 2020-03-17 | Discharge: 2020-03-17 | Disposition: A | Discharge status: Home / Self Care | Payer: BC Managed Care – PPO | Source: Ambulatory Visit | Attending: Orthopedic Surgery | Admitting: Orthopedic Surgery

## 2020-03-17 DIAGNOSIS — S42252A Displaced fracture of greater tuberosity of left humerus, initial encounter for closed fracture: Secondary | ICD-10-CM | POA: Diagnosis not present

## 2020-03-17 DIAGNOSIS — S42212A Unspecified displaced fracture of surgical neck of left humerus, initial encounter for closed fracture: Secondary | ICD-10-CM | POA: Diagnosis not present

## 2020-03-17 DIAGNOSIS — R600 Localized edema: Secondary | ICD-10-CM | POA: Diagnosis not present

## 2020-03-17 DIAGNOSIS — S42295A Other nondisplaced fracture of upper end of left humerus, initial encounter for closed fracture: Secondary | ICD-10-CM | POA: Diagnosis not present

## 2020-03-17 DIAGNOSIS — M25512 Pain in left shoulder: Secondary | ICD-10-CM

## 2020-03-18 ENCOUNTER — Ambulatory Visit: Payer: Self-pay | Admitting: Orthopedic Surgery

## 2020-03-18 ENCOUNTER — Ambulatory Visit (HOSPITAL_COMMUNITY)
Admission: RE | Admit: 2020-03-18 | Discharge: 2020-03-18 | Disposition: A | Discharge status: Home / Self Care | Payer: BC Managed Care – PPO | Source: Ambulatory Visit | Attending: Orthopedic Surgery | Admitting: Orthopedic Surgery

## 2020-03-18 ENCOUNTER — Other Ambulatory Visit: Payer: Self-pay

## 2020-03-18 ENCOUNTER — Ambulatory Visit (HOSPITAL_COMMUNITY): Payer: BC Managed Care – PPO | Admitting: Anesthesiology

## 2020-03-18 ENCOUNTER — Ambulatory Visit (HOSPITAL_COMMUNITY): Payer: BC Managed Care – PPO

## 2020-03-18 ENCOUNTER — Encounter (HOSPITAL_COMMUNITY): Payer: Self-pay | Admitting: Orthopedic Surgery

## 2020-03-18 ENCOUNTER — Encounter (HOSPITAL_COMMUNITY)
Admission: RE | Disposition: A | Discharge status: Home / Self Care | Payer: Self-pay | Source: Ambulatory Visit | Attending: Orthopedic Surgery

## 2020-03-18 DIAGNOSIS — S42202D Unspecified fracture of upper end of left humerus, subsequent encounter for fracture with routine healing: Secondary | ICD-10-CM | POA: Diagnosis not present

## 2020-03-18 DIAGNOSIS — G8918 Other acute postprocedural pain: Secondary | ICD-10-CM | POA: Diagnosis not present

## 2020-03-18 DIAGNOSIS — Z79890 Hormone replacement therapy (postmenopausal): Secondary | ICD-10-CM | POA: Insufficient documentation

## 2020-03-18 DIAGNOSIS — D638 Anemia in other chronic diseases classified elsewhere: Secondary | ICD-10-CM | POA: Diagnosis not present

## 2020-03-18 DIAGNOSIS — W010XXA Fall on same level from slipping, tripping and stumbling without subsequent striking against object, initial encounter: Secondary | ICD-10-CM | POA: Diagnosis not present

## 2020-03-18 DIAGNOSIS — S42202A Unspecified fracture of upper end of left humerus, initial encounter for closed fracture: Secondary | ICD-10-CM | POA: Diagnosis not present

## 2020-03-18 DIAGNOSIS — Z20822 Contact with and (suspected) exposure to covid-19: Secondary | ICD-10-CM | POA: Diagnosis not present

## 2020-03-18 DIAGNOSIS — E039 Hypothyroidism, unspecified: Secondary | ICD-10-CM | POA: Diagnosis not present

## 2020-03-18 DIAGNOSIS — Z4789 Encounter for other orthopedic aftercare: Secondary | ICD-10-CM | POA: Diagnosis not present

## 2020-03-18 DIAGNOSIS — Z419 Encounter for procedure for purposes other than remedying health state, unspecified: Secondary | ICD-10-CM

## 2020-03-18 HISTORY — PX: ORIF HUMERUS FRACTURE: SHX2126

## 2020-03-18 HISTORY — DX: Anemia, unspecified: D64.9

## 2020-03-18 HISTORY — DX: Hypothyroidism, unspecified: E03.9

## 2020-03-18 HISTORY — DX: Family history of other specified conditions: Z84.89

## 2020-03-18 LAB — BASIC METABOLIC PANEL
Anion gap: 16 — ABNORMAL HIGH (ref 5–15)
BUN: 6 mg/dL — ABNORMAL LOW (ref 8–23)
CO2: 25 mmol/L (ref 22–32)
Calcium: 9.4 mg/dL (ref 8.9–10.3)
Chloride: 99 mmol/L (ref 98–111)
Creatinine, Ser: 0.58 mg/dL (ref 0.44–1.00)
GFR, Estimated: >60 mL/min (ref >60)
Glucose, Bld: 122 mg/dL — ABNORMAL HIGH (ref 70–99)
Potassium: 4.2 mmol/L (ref 3.5–5.1)
Sodium: 140 mmol/L (ref 135–145)

## 2020-03-18 LAB — CBC
HCT: 40.3 % (ref 36.0–46.0)
Hemoglobin: 13.3 g/dL (ref 12.0–15.0)
MCH: 31.5 pg (ref 26.0–34.0)
MCHC: 33 g/dL (ref 30.0–36.0)
MCV: 95.5 fL (ref 80.0–100.0)
Platelets: 260 10*3/uL (ref 150–400)
RBC: 4.22 MIL/uL (ref 3.87–5.11)
RDW: 12.1 % (ref 11.5–15.5)
WBC: 6.3 10*3/uL (ref 4.0–10.5)
nRBC: 0 % (ref 0.0–0.2)

## 2020-03-18 LAB — SURGICAL PCR SCREEN
MRSA, PCR: NEGATIVE
Staphylococcus aureus: NEGATIVE

## 2020-03-18 LAB — SARS CORONAVIRUS 2 BY RT PCR (HOSPITAL ORDER, PERFORMED IN ~~LOC~~ HOSPITAL LAB): SARS Coronavirus 2: NEGATIVE

## 2020-03-18 SURGERY — OPEN REDUCTION INTERNAL FIXATION (ORIF) PROXIMAL HUMERUS FRACTURE
Anesthesia: General | Site: Arm Upper | Laterality: Left

## 2020-03-18 MED ORDER — BUPIVACAINE-EPINEPHRINE (PF) 0.5% -1:200000 IJ SOLN
INTRAMUSCULAR | Status: DC | PRN
  Administered 2020-03-18: 20 mL via PERINEURAL

## 2020-03-18 MED ORDER — BUPIVACAINE LIPOSOME 1.3 % IJ SUSP
INTRAMUSCULAR | Status: DC | PRN
  Administered 2020-03-18: 10 mL via PERINEURAL

## 2020-03-18 MED ORDER — FENTANYL CITRATE (PF) 100 MCG/2ML IJ SOLN
INTRAMUSCULAR | Status: AC
  Filled 2020-03-18: qty 2

## 2020-03-18 MED ORDER — MIDAZOLAM HCL 2 MG/2ML IJ SOLN
INTRAMUSCULAR | Status: AC
  Administered 2020-03-18: 2 mg via INTRAVENOUS
  Filled 2020-03-18: qty 2

## 2020-03-18 MED ORDER — ACETAMINOPHEN 500 MG PO TABS
1000.0000 mg | ORAL_TABLET | Freq: Once | ORAL | Status: AC
  Administered 2020-03-18: 1000 mg via ORAL
  Filled 2020-03-18: qty 2

## 2020-03-18 MED ORDER — PROPOFOL 10 MG/ML IV BOLUS
INTRAVENOUS | Status: AC
  Filled 2020-03-18: qty 20

## 2020-03-18 MED ORDER — CELECOXIB 200 MG PO CAPS
200.0000 mg | ORAL_CAPSULE | Freq: Once | ORAL | Status: AC
  Administered 2020-03-18: 200 mg via ORAL
  Filled 2020-03-18: qty 1

## 2020-03-18 MED ORDER — PHENYLEPHRINE HCL-NACL 10-0.9 MG/250ML-% IV SOLN
INTRAVENOUS | Status: DC | PRN
  Administered 2020-03-18: 30 ug/min via INTRAVENOUS

## 2020-03-18 MED ORDER — TRANEXAMIC ACID-NACL 1000-0.7 MG/100ML-% IV SOLN
1000.0000 mg | INTRAVENOUS | Status: AC
  Administered 2020-03-18: 1000 mg via INTRAVENOUS

## 2020-03-18 MED ORDER — TRANEXAMIC ACID-NACL 1000-0.7 MG/100ML-% IV SOLN
INTRAVENOUS | Status: AC
  Filled 2020-03-18: qty 100

## 2020-03-18 MED ORDER — VANCOMYCIN HCL 1000 MG IV SOLR
INTRAVENOUS | Status: AC
  Filled 2020-03-18: qty 1000

## 2020-03-18 MED ORDER — OXYCODONE-ACETAMINOPHEN 5-325 MG PO TABS: 1.0000 | ORAL_TABLET | ORAL | 0 refills | Status: DC | PRN

## 2020-03-18 MED ORDER — MIDAZOLAM HCL 5 MG/5ML IJ SOLN: INTRAMUSCULAR | Status: DC | PRN

## 2020-03-18 MED ORDER — FENTANYL CITRATE (PF) 100 MCG/2ML IJ SOLN
100.0000 ug | Freq: Once | INTRAMUSCULAR | Status: AC
  Administered 2020-03-18: 100 ug via INTRAVENOUS

## 2020-03-18 MED ORDER — PROPOFOL 10 MG/ML IV BOLUS
INTRAVENOUS | Status: DC | PRN
  Administered 2020-03-18: 120 mg via INTRAVENOUS

## 2020-03-18 MED ORDER — ORAL CARE MOUTH RINSE: 15.0000 mL | Freq: Once | OROMUCOSAL | Status: AC

## 2020-03-18 MED ORDER — ROCURONIUM BROMIDE 10 MG/ML (PF) SYRINGE
PREFILLED_SYRINGE | INTRAVENOUS | Status: DC | PRN
  Administered 2020-03-18: 40 mg via INTRAVENOUS
  Administered 2020-03-18: 60 mg via INTRAVENOUS

## 2020-03-18 MED ORDER — FENTANYL CITRATE (PF) 250 MCG/5ML IJ SOLN
INTRAMUSCULAR | Status: AC
  Filled 2020-03-18: qty 5

## 2020-03-18 MED ORDER — CEFAZOLIN SODIUM-DEXTROSE 2-4 GM/100ML-% IV SOLN
2.0000 g | INTRAVENOUS | Status: AC
  Administered 2020-03-18: 2 g via INTRAVENOUS

## 2020-03-18 MED ORDER — DEXAMETHASONE SODIUM PHOSPHATE 10 MG/ML IJ SOLN
INTRAMUSCULAR | Status: DC | PRN
  Administered 2020-03-18: 10 mg via INTRAVENOUS

## 2020-03-18 MED ORDER — CHLORHEXIDINE GLUCONATE 0.12 % MT SOLN: 15.0000 mL | Freq: Once | OROMUCOSAL | Status: AC

## 2020-03-18 MED ORDER — EPHEDRINE SULFATE-NACL 50-0.9 MG/10ML-% IV SOSY
PREFILLED_SYRINGE | INTRAVENOUS | Status: DC | PRN
  Administered 2020-03-18: 10 mg via INTRAVENOUS

## 2020-03-18 MED ORDER — GLYCOPYRROLATE 0.2 MG/ML IJ SOLN
INTRAMUSCULAR | Status: DC | PRN
  Administered 2020-03-18: .1 mg via INTRAVENOUS

## 2020-03-18 MED ORDER — VANCOMYCIN HCL 1000 MG IV SOLR
INTRAVENOUS | Status: DC | PRN
  Administered 2020-03-18: 1000 mg via TOPICAL

## 2020-03-18 MED ORDER — SODIUM CHLORIDE 0.9 % IR SOLN
Status: DC | PRN
  Administered 2020-03-18 (×2): 1000 mL

## 2020-03-18 MED ORDER — CHLORHEXIDINE GLUCONATE 0.12 % MT SOLN
OROMUCOSAL | Status: AC
  Administered 2020-03-18: 15 mL via OROMUCOSAL
  Filled 2020-03-18: qty 15

## 2020-03-18 MED ORDER — CEFAZOLIN SODIUM-DEXTROSE 2-4 GM/100ML-% IV SOLN
INTRAVENOUS | Status: AC
  Filled 2020-03-18: qty 100

## 2020-03-18 MED ORDER — SUGAMMADEX SODIUM 200 MG/2ML IV SOLN
INTRAVENOUS | Status: DC | PRN
  Administered 2020-03-18: 125 mg via INTRAVENOUS

## 2020-03-18 MED ORDER — CHLORHEXIDINE GLUCONATE 4 % EX LIQD: 60.0000 mL | Freq: Once | CUTANEOUS | Status: DC

## 2020-03-18 MED ORDER — LACTATED RINGERS IV SOLN: INTRAVENOUS | Status: DC

## 2020-03-18 MED ORDER — MIDAZOLAM HCL 2 MG/2ML IJ SOLN: 2.0000 mg | Freq: Once | INTRAMUSCULAR | Status: AC

## 2020-03-18 MED ORDER — POVIDONE-IODINE 10 % EX SWAB
2.0000 "application " | Freq: Once | CUTANEOUS | Status: AC
  Administered 2020-03-18: 2 via TOPICAL

## 2020-03-18 MED ORDER — CELECOXIB 100 MG PO CAPS: 100.0000 mg | ORAL_CAPSULE | Freq: Two times a day (BID) | ORAL | 0 refills | Status: AC

## 2020-03-18 MED ORDER — METHOCARBAMOL 500 MG PO TABS: 500.0000 mg | ORAL_TABLET | Freq: Three times a day (TID) | ORAL | 0 refills | Status: DC | PRN

## 2020-03-18 SURGICAL SUPPLY — 61 items
ALCOHOL 70% 16 OZ (MISCELLANEOUS) ×3 IMPLANT
APL PRP STRL LF DISP 70% ISPRP (MISCELLANEOUS) ×1
BIT DRILL 3.2 (BIT) ×3
BIT DRILL 3.2XCALB NS DISP (BIT) IMPLANT
BIT DRILL CALIBRATED 2.7 (BIT) ×1 IMPLANT
BIT DRILL CALIBRATED 2.7MM (BIT) ×1
BIT DRL 3.2XCALB NS DISP (BIT) ×1
BNDG COHESIVE 4X5 TAN STRL (GAUZE/BANDAGES/DRESSINGS) ×3 IMPLANT
CHLORAPREP W/TINT 26 (MISCELLANEOUS) ×4 IMPLANT
CLOSURE STERI-STRIP 1/2X4 (GAUZE/BANDAGES/DRESSINGS) ×1
CLSR STERI-STRIP ANTIMIC 1/2X4 (GAUZE/BANDAGES/DRESSINGS) ×1 IMPLANT
COVER SURGICAL LIGHT HANDLE (MISCELLANEOUS) ×5 IMPLANT
DRAPE C-ARM 42X72 X-RAY (DRAPES) ×2 IMPLANT
DRAPE IMP U-DRAPE 54X76 (DRAPES) ×3 IMPLANT
DRAPE U-SHAPE 47X51 STRL (DRAPES) ×6 IMPLANT
DRSG AQUACEL AG ADV 3.5X10 (GAUZE/BANDAGES/DRESSINGS) ×2 IMPLANT
ELECT REM PT RETURN 9FT ADLT (ELECTROSURGICAL) ×3
ELECTRODE REM PT RTRN 9FT ADLT (ELECTROSURGICAL) ×1 IMPLANT
GLOVE BIOGEL PI IND STRL 8 (GLOVE) ×1 IMPLANT
GLOVE BIOGEL PI INDICATOR 8 (GLOVE) ×2
GLOVE ECLIPSE 8.0 STRL XLNG CF (GLOVE) ×3 IMPLANT
GOWN STRL REUS W/ TWL LRG LVL3 (GOWN DISPOSABLE) ×2 IMPLANT
GOWN STRL REUS W/ TWL XL LVL3 (GOWN DISPOSABLE) ×1 IMPLANT
GOWN STRL REUS W/TWL LRG LVL3 (GOWN DISPOSABLE) ×6
GOWN STRL REUS W/TWL XL LVL3 (GOWN DISPOSABLE) ×3
HYDROGEN PEROXIDE 16OZ (MISCELLANEOUS) ×3 IMPLANT
JET LAVAGE IRRISEPT WOUND (IRRIGATION / IRRIGATOR) ×3
K-WIRE 2X5 SS THRDED S3 (WIRE) ×18
KIT BASIN OR (CUSTOM PROCEDURE TRAY) ×3 IMPLANT
KIT TURNOVER KIT B (KITS) ×3 IMPLANT
KWIRE 2X5 SS THRDED S3 (WIRE) IMPLANT
LAVAGE JET IRRISEPT WOUND (IRRIGATION / IRRIGATOR) IMPLANT
NS IRRIG 1000ML POUR BTL (IV SOLUTION) ×3 IMPLANT
PACK SHOULDER (CUSTOM PROCEDURE TRAY) ×3 IMPLANT
PEG LOCKING 3.2MMX26MM (Peg) ×2 IMPLANT
PEG LOCKING 3.2MMX44 (Peg) ×4 IMPLANT
PEG LOCKING 3.2X 28MM (Peg) ×4 IMPLANT
PEG LOCKING 3.2X34 (Screw) ×6 IMPLANT
PEG LOCKING 3.2X36 (Screw) ×2 IMPLANT
PLATE PROX HUM HI L 3H 80 (Plate) ×2 IMPLANT
SCREW LOW PROF TIS 3.5X28MM (Screw) ×2 IMPLANT
SCREW LP NL T15 3.5X26 (Screw) ×4 IMPLANT
SCREW PEG LOCK 3.2X30MM (Screw) ×2 IMPLANT
SLEEVE MEASURING 3.2 (BIT) ×2 IMPLANT
SLING ARM IMMOBILIZER LRG (SOFTGOODS) ×2 IMPLANT
SPONGE LAP 4X18 RFD (DISPOSABLE) IMPLANT
STOCKINETTE IMPERVIOUS 9X36 MD (GAUZE/BANDAGES/DRESSINGS) ×2 IMPLANT
SUCTION FRAZIER HANDLE 10FR (MISCELLANEOUS) ×3
SUCTION TUBE FRAZIER 10FR DISP (MISCELLANEOUS) IMPLANT
SUT MNCRL AB 3-0 PS2 27 (SUTURE) ×2 IMPLANT
SUT SILK 2 0 (SUTURE) ×6
SUT SILK 2-0 18XBRD TIE 12 (SUTURE) IMPLANT
SUT VIC AB 0 CT1 27 (SUTURE) ×6
SUT VIC AB 0 CT1 27XBRD ANBCTR (SUTURE) IMPLANT
SUT VIC AB 1 CT1 27 (SUTURE) ×6
SUT VIC AB 1 CT1 27XBRD ANBCTR (SUTURE) IMPLANT
SUT VIC AB 1 CT1 36 (SUTURE) ×4 IMPLANT
SUT VIC AB 2-0 CTB1 (SUTURE) ×4 IMPLANT
SUT VICRYL 0 UR6 27IN ABS (SUTURE) ×4 IMPLANT
TOWEL GREEN STERILE FF (TOWEL DISPOSABLE) ×3 IMPLANT
YANKAUER SUCT BULB TIP NO VENT (SUCTIONS) ×2 IMPLANT

## 2020-03-18 NOTE — Anesthesia Procedure Notes (Signed)
Procedure Name: Intubation Date/Time: 03/18/2020 5:04 PM Performed by: Griffin Dakin, CRNA Pre-anesthesia Checklist: Patient identified, Emergency Drugs available, Suction available and Patient being monitored Patient Re-evaluated:Patient Re-evaluated prior to induction Oxygen Delivery Method: Circle system utilized Preoxygenation: Pre-oxygenation with 100% oxygen Induction Type: IV induction Ventilation: Mask ventilation without difficulty Laryngoscope Size: Mac and 4 Grade View: Grade I Tube type: Oral Tube size: 7.0 mm Number of attempts: 1 Airway Equipment and Method: Stylet Placement Confirmation: ETT inserted through vocal cords under direct vision,  positive ETCO2 and breath sounds checked- equal and bilateral Secured at: 23 cm Tube secured with: Tape Dental Injury: Teeth and Oropharynx as per pre-operative assessment

## 2020-03-18 NOTE — Progress Notes (Signed)
I called.  We set her up for surgery.  Discussed with her the risk and benefits.

## 2020-03-18 NOTE — Anesthesia Preprocedure Evaluation (Addendum)
Anesthesia Evaluation  Patient identified by MRN, date of birth, ID band Patient awake    Reviewed: Allergy & Precautions, H&P , NPO status , Patient's Chart, lab work & pertinent test results  Airway Mallampati: I  TM Distance: >3 FB     Dental no notable dental hx. (+) Teeth Intact, Dental Advisory Given   Pulmonary neg pulmonary ROS,    Pulmonary exam normal breath sounds clear to auscultation       Cardiovascular negative cardio ROS   Rhythm:Regular Rate:Normal     Neuro/Psych negative neurological ROS  negative psych ROS   GI/Hepatic negative GI ROS, Neg liver ROS,   Endo/Other  Hypothyroidism   Renal/GU negative Renal ROS  negative genitourinary   Musculoskeletal   Abdominal   Peds  Hematology  (+) Blood dyscrasia, anemia ,   Anesthesia Other Findings   Reproductive/Obstetrics negative OB ROS                            Anesthesia Physical Anesthesia Plan  ASA: II  Anesthesia Plan: General   Post-op Pain Management:  Regional for Post-op pain   Induction: Intravenous  PONV Risk Score and Plan: 3 and Ondansetron, Dexamethasone and Midazolam  Airway Management Planned: Oral ETT  Additional Equipment:   Intra-op Plan:   Post-operative Plan: Extubation in OR  Informed Consent: I have reviewed the patients History and Physical, chart, labs and discussed the procedure including the risks, benefits and alternatives for the proposed anesthesia with the patient or authorized representative who has indicated his/her understanding and acceptance.     Dental advisory given  Plan Discussed with: CRNA  Anesthesia Plan Comments:         Anesthesia Quick Evaluation

## 2020-03-18 NOTE — Brief Op Note (Signed)
   03/18/2020  7:48 PM  PATIENT:  Colleen Orr  59 y.o. female  PRE-OPERATIVE DIAGNOSIS:  Left Promimal Humerus Fracture  POST-OPERATIVE DIAGNOSIS:  Left Promimal Humerus Fracture  PROCEDURE:  Procedure(s): OPEN REDUCTION INTERNAL FIXATION (ORIF) PROXIMAL HUMERUS FRACTURE  SURGEON:  Surgeon(s): August Saucer, Corrie Mckusick, MD  ASSISTANT: Karenann Cai pa  ANESTHESIA:   general  EBL: 75 ml    Total I/O In: -  Out: 75 [Blood:75]  BLOOD ADMINISTERED: none  DRAINS: none   LOCAL MEDICATIONS USED:  none  SPECIMEN:  No Specimen  COUNTS:  YES  TOURNIQUET:  * No tourniquets in log *  DICTATION: .Other Dictation: Dictation Number [id number]  PLAN OF CARE: Discharge to home after PACU  PATIENT DISPOSITION:  PACU - hemodynamically stable

## 2020-03-18 NOTE — Transfer of Care (Signed)
Immediate Anesthesia Transfer of Care Note  Patient: Colleen Orr  Procedure(s) Performed: OPEN REDUCTION INTERNAL FIXATION (ORIF) PROXIMAL HUMERUS FRACTURE (Left Arm Upper)  Patient Location: PACU  Anesthesia Type:GA combined with regional for post-op pain  Level of Consciousness: awake, alert , oriented and patient cooperative  Airway & Oxygen Therapy: Patient Spontanous Breathing  Post-op Assessment: Report given to RN and Post -op Vital signs reviewed and stable  Post vital signs: Reviewed and stable  Last Vitals:  Vitals Value Taken Time  BP 127/70 03/18/20 1935  Temp    Pulse 70 03/18/20 1940  Resp 19 03/18/20 1940  SpO2 95 % 03/18/20 1940  Vitals shown include unvalidated device data.  Last Pain:  Vitals:   03/18/20 1935  TempSrc:   PainSc: (P) 0-No pain      Patients Stated Pain Goal: 2 (03/18/20 1418)  Complications: No complications documented.

## 2020-03-18 NOTE — Anesthesia Procedure Notes (Signed)
Anesthesia Regional Block: Interscalene brachial plexus block   Pre-Anesthetic Checklist: ,, timeout performed, Correct Patient, Correct Site, Correct Laterality, Correct Procedure, Correct Position, site marked, Risks and benefits discussed, pre-op evaluation,  At surgeon's request and post-op pain management  Laterality: Left  Prep: Maximum Sterile Barrier Precautions used, chloraprep       Needles:  Injection technique: Single-shot  Needle Type: Echogenic Stimulator Needle     Needle Length: 5cm  Needle Gauge: 22     Additional Needles:   Procedures:,,,, ultrasound used (permanent image in chart),,,,  Narrative:  Start time: 03/18/2020 2:04 PM End time: 03/18/2020 4:14 PM Injection made incrementally with aspirations every 5 mL. Anesthesiologist: Gaynelle Adu, MD  Additional Notes: 2% Lidocaine skin wheel.

## 2020-03-18 NOTE — H&P (Signed)
Colleen Orr is an 59 y.o. female.   Chief Complaint: Left shoulder pain HPI: Colleen Orr is an active 59 year old patient who was training for marathon when she fell and injured her left shoulder.  Denies any other orthopedic complaints.  Has not had problems with the left shoulder before.  Presents now for operative management after explanation risk benefits.  CT scan demonstrates displaced comminuted proximal humerus fracture.  Past Medical History:  Diagnosis Date  . Anemia   . Celiac disease   . Family history of adverse reaction to anesthesia    Mother had post op N/V  . Hypothyroidism   . Thyroid disease     Past Surgical History:  Procedure Laterality Date  . APPENDECTOMY    . HERNIA REPAIR    . TUBAL LIGATION      History reviewed. No pertinent family history. Social History:  reports that she has never smoked. She has never used smokeless tobacco. She reports current alcohol use of about 7.0 standard drinks of alcohol per week. She reports that she does not use drugs.  Allergies: No Known Allergies  Medications Prior to Admission  Medication Sig Dispense Refill  . levothyroxine (SYNTHROID, LEVOTHROID) 88 MCG tablet     . cephALEXin (KEFLEX) 500 MG capsule Take 1 capsule (500 mg total) by mouth 4 (four) times daily. 28 capsule 0    Results for orders placed or performed during the hospital encounter of 03/18/20 (from the past 48 hour(s))  SARS Coronavirus 2 by RT PCR (hospital order, performed in [facility] hospital lab) Nasopharyngeal Nasopharyngeal Swab     Status: None   Collection Time: 03/18/20  1:59 PM   Specimen: Nasopharyngeal Swab  Result Value Ref Range   SARS Coronavirus 2 NEGATIVE NEGATIVE    Comment: (NOTE) SARS-CoV-2 target nucleic acids are NOT DETECTED.  The SARS-CoV-2 RNA is generally detectable in upper and lower respiratory specimens during the acute phase of infection. The lowest concentration of SARS-CoV-2 viral copies this assay can detect  is 250 copies / mL. A negative result does not preclude SARS-CoV-2 infection and should not be used as the sole basis for treatment or other patient management decisions.  A negative result may occur with improper specimen collection / handling, submission of specimen other than nasopharyngeal swab, presence of viral mutation(s) within the areas targeted by this assay, and inadequate number of viral copies (<250 copies / mL). A negative result must be combined with clinical observations, patient history, and epidemiological information.  Fact Sheet for Patients:   BoilerBrush.com.cy  Fact Sheet for Healthcare Providers: https://pope.com/  This test is not yet approved or  cleared by the [place] FDA and has been authorized for detection and/or diagnosis of SARS-CoV-2 by FDA under an Emergency Use Authorization (EUA).  This EUA will remain in effect (meaning this test can be used) for the duration of the COVID-19 declaration under Section 564(b)(1) of the Act, 21 U.S.C. section 360bbb-3(b)(1), unless the authorization is terminated or revoked sooner.  Performed at [facility], [address], [place], [place] [address]   CBC     Status: None   Collection Time: 03/18/20  2:09 PM  Result Value Ref Range   WBC 6.3 4.0 - 10.5 K/uL   RBC 4.22 3.87 - 5.11 MIL/uL   Hemoglobin 13.3 12.0 - 15.0 g/dL   HCT 60.4 36 - 46 %   MCV 95.5 80.0 - 100.0 fL   MCH 31.5 26.0 - 34.0 pg  MCHC 33.0 30.0 - 36.0 g/dL   RDW 37.1 06.2 - 69.4 %   Platelets 260 150 - 400 K/uL   nRBC 0.0 0.0 - 0.2 %    Comment: Performed at [facility], [address], [place], [place] [address]  Basic metabolic panel     Status: Abnormal   Collection Time: 03/18/20  2:09 PM  Result Value Ref Range   Sodium 140 135 - 145 mmol/L   Potassium 4.2 3.5 - 5.1 mmol/L   Chloride 99 98 - 111 mmol/L   CO2 25 22 - 32 mmol/L   Glucose, Bld 122 (H) 70 - 99 mg/dL     Comment: Glucose reference range applies only to samples taken after fasting for at least 8 hours.   BUN 6 (L) 8 - 23 mg/dL   Creatinine, Ser 7.03 0.44 - 1.00 mg/dL   Calcium 9.4 8.9 - 50.0 mg/dL   GFR, Estimated >93 >81 mL/min    Comment: (NOTE) Calculated using the CKD-EPI Creatinine Equation (2021)    Anion gap 16 (H) 5 - 15    Comment: Performed at [facility], [address], [place], [place] [address]  Surgical pcr screen     Status: None   Collection Time: 03/18/20  2:10 PM   Specimen: Nasal Mucosa; Nasal Swab  Result Value Ref Range   MRSA, PCR NEGATIVE NEGATIVE   Staphylococcus aureus NEGATIVE NEGATIVE    Comment: (NOTE) The Xpert SA Assay (FDA approved for NASAL specimens in patients 83 years of age and older), is one component of a comprehensive surveillance program. It is not intended to diagnose infection nor to guide or monitor treatment. Performed at [facility], [address], [place], [place] [address]    CT SHOULDER LEFT WO CONTRAST  Result Date: 03/17/2020 CLINICAL DATA:  The patient suffered a proximal left humerus fracture in a trip and fall 03/14/2020. Initial encounter. EXAM: CT OF THE UPPER LEFT EXTREMITY WITHOUT CONTRAST TECHNIQUE: Multidetector CT imaging of the upper left extremity was performed according to the standard protocol. COMPARISON:  Plain films left shoulder 03/16/2020. FINDINGS: Bones/Joint/Cartilage As seen on the comparison plain films, the patient has a surgical neck fracture of the left humerus. The shaft of the humerus is anteriorly and medially displaced approximately 1.5 cm. There is fragment override laterally of 1.8 cm. Nondisplaced fractures involve the greater and lesser tuberosities. The humeral head is located. No other fracture is identified. The acromioclavicular joint is intact and normal appearance. The acromion is type 1. Ligaments Suboptimally assessed by CT. Muscles and Tendons Appear intact. Soft tissues There is  some edema and hemorrhage about the patient's fracture. Imaged lung parenchyma is clear. IMPRESSION: Impacted and displaced surgical humerus neck fracture as described above. Nondisplaced component of the fracture involves both the greater and lesser tuberosities. The exam is otherwise negative. Electronically Signed   By: Drusilla Kanner M.D.   On: 03/17/2020 11:07    Review of Systems  Musculoskeletal: Positive for arthralgias.  All other systems reviewed and are negative.   Blood pressure 136/65, pulse 62, temperature 98.1 F (36.7 C), temperature source Oral, resp. rate 20, height 5\' 6"  (1.676 m), weight 59 kg, SpO2 98 %. Physical Exam Vitals reviewed.  HENT:     Head: Normocephalic.     Nose: Nose normal.  Eyes:     Pupils: Pupils are equal, round, and reactive to light.  Cardiovascular:     Rate and Rhythm: Normal rate.  Pulses: Normal pulses.  Pulmonary:     Effort: Pulmonary effort is normal.  Abdominal:     General: Abdomen is flat.  Musculoskeletal:     Cervical back: Normal range of motion.  Skin:    General: Skin is warm.     Capillary Refill: Capillary refill takes less than 2 seconds.  Neurological:     General: No focal deficit present.     Mental Status: She is alert.  Psychiatric:        Mood and Affect: Mood normal.   Examination of the left shoulder demonstrates swelling which is present.  Deltoid does fire.  Motor sensory function of the hand is intact.  Radial pulses intact.  Skin is intact in the left shoulder girdle region.  Assessment/Plan Impression is four-part proximal humerus fracture and a very active 59 year old patient.  She is right-hand dominant.  CT scan shows fracture displacement between one half and 2 cm.  I think in general someone is active and vibrant as [place] would do better with open reduction internal fixation to achieve a better shoulder range of motion.  The risk and benefits of surgery are discussed with her including not  limited to infection nerve vessel damage as well as the significant chance for avascular necrosis either with or without operative therapy.  Patient understands risk benefits and wishes to proceed.  All questions answered.  Burnard Bunting, MD 03/18/2020, 4:50 PM

## 2020-03-18 NOTE — Anesthesia Postprocedure Evaluation (Signed)
Anesthesia Post Note  Patient: Colleen Orr  Procedure(s) Performed: OPEN REDUCTION INTERNAL FIXATION (ORIF) PROXIMAL HUMERUS FRACTURE (Left Arm Upper)     Patient location during evaluation: PACU Anesthesia Type: General Level of consciousness: awake and alert Pain management: pain level controlled Vital Signs Assessment: post-procedure vital signs reviewed and stable Respiratory status: spontaneous breathing, nonlabored ventilation, respiratory function stable and patient connected to nasal cannula oxygen Cardiovascular status: blood pressure returned to baseline and stable Postop Assessment: no apparent nausea or vomiting Anesthetic complications: no   No complications documented.  Last Vitals:  Vitals:   03/18/20 1950 03/18/20 2005  BP: 125/72 136/86  Pulse: 74 69  Resp: 17 18  Temp:  (!) 36.3 C  SpO2: 99% 97%    Last Pain:  Vitals:   03/18/20 2005  TempSrc:   PainSc: 0-No pain                 Alem Fahl DAVID

## 2020-03-20 ENCOUNTER — Encounter (HOSPITAL_COMMUNITY): Payer: Self-pay | Admitting: Orthopedic Surgery

## 2020-03-22 ENCOUNTER — Encounter: Payer: Self-pay | Admitting: Orthopedic Surgery

## 2020-03-22 NOTE — Progress Notes (Signed)
Office Visit Note   Patient: Colleen Orr           Date of Birth: 02-Jun-1960           MRN: [id number] Visit Date: 03/16/2020 Requested by: Joycelyn Rua, MD [address] [place],  [place] [address] PCP: Joycelyn Rua, MD  Subjective: Chief Complaint  Patient presents with  . Left Shoulder - Injury    HPI: Colleen Orr is an active 59 year old patient with left shoulder pain.  Date of injury 03/14/2020.  She was running she tripped and landed on the left-hand side.  She is right-hand dominant.  Went to [facility].  Has been taking Ultram.  She is retired.  She likes to lift weights and exercise.  She was training for marathon.  She has celiac disease as well.  Denies any other orthopedic complaints.              ROS: All systems reviewed are negative as they relate to the chief complaint within the history of present illness.  Patient denies  fevers or chills.   Assessment & Plan: Visit Diagnoses:  1. Left shoulder pain, unspecified chronicity     Plan: Impression is left proximal humerus fracture with displacement.  Based on her activity level this looks like it is an operative fracture.  Definitely on the young side for reverse replacement.  I think open reduction internal fixation could buy [place] some time and potentially be definitive treatment if avascular necrosis does not develop.  Risk and benefits of surgery are discussed include not limited to infection nerve vessel damage potential need for more surgery if avascular necrosis develops.  Patient understands the risk and benefits and wishes to proceed.  I did tell her that even with optimal results full range of motion of the shoulder is likely not to be restored.  Patient understands and wishes to proceed.  I did discuss her CT scan findings with her prior to surgery which has been performed as of this dictation time.  Follow-Up Instructions: Return if symptoms worsen or fail to improve.   Orders:  Orders  Placed This Encounter  Procedures  . XR Shoulder Left  . CT SHOULDER LEFT WO CONTRAST   No orders of the defined types were placed in this encounter.     Procedures: No procedures performed   Clinical Data: No additional findings.  Objective: Vital Signs: There were no vitals taken for this visit.  Physical Exam:   Constitutional: Patient appears well-developed HEENT:  Head: Normocephalic Eyes:EOM are normal Neck: Normal range of motion Cardiovascular: Normal rate Pulmonary/chest: Effort normal Neurologic: Patient is alert Skin: Skin is warm Psychiatric: Patient has normal mood and affect    Ortho Exam: Ortho exam demonstrates intact deltoid function although that is somewhat difficult to assess but it does seem like it fires with ice so metric resistance.  Motor sensory function hand is intact.  Expected amount of post fracture swelling is present in the left arm.  Radial pulses intact.  Elbow wrist on the left-hand side have good motion without crepitus.  Specialty Comments:  No specialty comments available.  Imaging: No results found.   PMFS History: Patient Active Problem List   Diagnosis Date Noted  . Right leg injury, initial encounter 09/24/2017  . Hamstring strain, left, initial encounter 05/30/2017  . Left hip pain 02/07/2014  . ABNORMALITY OF GAIT 02/10/2009  . PAIN IN JOINT PELVIC REGION AND THIGH 12/30/2008  . Hamstring muscle  strain, right, initial encounter 12/30/2008  . PATELLO-FEMORAL SYNDROME 01/28/2008   Past Medical History:  Diagnosis Date  . Anemia   . Celiac disease   . Family history of adverse reaction to anesthesia    Mother had post op N/V  . Hypothyroidism   . Thyroid disease     No family history on file.  Past Surgical History:  Procedure Laterality Date  . APPENDECTOMY    . HERNIA REPAIR    . ORIF HUMERUS FRACTURE Left 03/18/2020   Procedure: OPEN REDUCTION INTERNAL FIXATION (ORIF) PROXIMAL HUMERUS FRACTURE;  Surgeon:  Cammy Copa, MD;  Location: [facility] OR;  Service: Orthopedics;  Laterality: Left;  . TUBAL LIGATION     Social History   Occupational History  . Not on file  Tobacco Use  . Smoking status: Never Smoker  . Smokeless tobacco: Never Used  Vaping Use  . Vaping Use: Never used  Substance and Sexual Activity  . Alcohol use: Yes    Alcohol/week: 7.0 standard drinks    Types: 7 Glasses of wine per week  . Drug use: Never  . Sexual activity: Not on file

## 2020-03-23 ENCOUNTER — Telehealth: Payer: Self-pay

## 2020-03-23 ENCOUNTER — Other Ambulatory Visit: Payer: Self-pay | Admitting: Surgical

## 2020-03-23 MED ORDER — OXYCODONE-ACETAMINOPHEN 5-325 MG PO TABS: 1.0000 | ORAL_TABLET | Freq: Four times a day (QID) | ORAL | 0 refills | Status: DC | PRN

## 2020-03-23 NOTE — Telephone Encounter (Signed)
Sent in

## 2020-03-23 NOTE — Telephone Encounter (Signed)
Pls advise.  

## 2020-03-23 NOTE — Op Note (Signed)
NAME: Telfair, Graziella A. MEDICAL RECORD IP:[id number] ACCOUNT [id number] DATE OF BIRTH:09/22/1960 FACILITY: [facility] LOCATION: [facility] PHYSICIAN:Taber Sweetser Diamantina Providence, MD  OPERATIVE REPORT  DATE OF PROCEDURE:  03/18/2020  PREOPERATIVE DIAGNOSIS:  Left proximal humerus fracture.  POSTOPERATIVE DIAGNOSIS:  Left proximal humerus fracture.  PROCEDURE:  Left proximal humerus fracture open reduction and internal fixation, plating with [employer] proximal humerus plate.  SURGEON:  Cammy Copa, MD  ASSISTANT:  Karenann Cai, PA  INDICATIONS:  The patient is a 59 year old patient with left proximal humerus fracture, presents for operative management after explanation of risks and benefits.  Fracture is displaced.  She is very active.  PROCEDURE IN DETAIL:  The patient was brought to the operating room where general anesthetic was induced.  Preoperative antibiotics administered.  Timeout was called.  Left shoulder, arm and hand prescrubbed with alcohol and Betadine, allowed to air dry,   prepped with DuraPrep solution and draped in a sterile manner.  Ioban used to cover the operative field.  Timeout was called.  Deltopectoral approach was made.  Cephalic vein mobilized laterally.  Deltopectoral interval was developed.  Fracture site  was visualized.  Deltoid manually elevated from its anterior attachment site to assist with visualization.  Fracture site was irrigated.  Care was taken not to disturb the blood supply of the anterior circumflex vessels.  Fracture reduction was achieved.   Stay sutures were placed in the rotator cuff tendons of the supraspinatus and infraspinatus for control of the proximal fragment.  The shaft was primarily anterior and medial.  Shaft was pushed posteriorly and a plate was applied and held with K-wires.   Good fracture reduction was confirmed in the AP and lateral planes under fluoroscopy.  Screws were then placed in the shaft and head with maintenance of reduction.   Soft tissue attachments were maintained on the head and shaft.  Following fracture  fixation, the arm was taken through range of motion and found to be in good position with good stability.  Thorough irrigation was then performed.  The rotator cuff stay sutures were tied to the plate.  Vancomycin powder placed.  Deltopectoral interval  closed using 0 Vicryl suture followed by interrupted inverted 2-0 Vicryl suture and 3-0 Monocryl and Steri-Strips and Aquacel.  Shoulder immobilizer placed.  Luke's assistance was required for retraction, mobilization of tissues, opening and closing.   His assistance was a medical necessity.  HN/NUANCE  D:03/18/2020 T:03/19/2020 JOB:[id number]

## 2020-03-23 NOTE — Telephone Encounter (Signed)
patient called she is requesting rx refill for oxycodone [phone]

## 2020-03-23 NOTE — Telephone Encounter (Signed)
LMVM advising submitted.

## 2020-03-25 ENCOUNTER — Other Ambulatory Visit: Payer: Self-pay

## 2020-03-25 ENCOUNTER — Ambulatory Visit (INDEPENDENT_AMBULATORY_CARE_PROVIDER_SITE_OTHER): Payer: BC Managed Care – PPO | Admitting: Orthopedic Surgery

## 2020-03-25 ENCOUNTER — Ambulatory Visit (INDEPENDENT_AMBULATORY_CARE_PROVIDER_SITE_OTHER): Payer: BC Managed Care – PPO

## 2020-03-25 ENCOUNTER — Encounter: Payer: Self-pay | Admitting: Orthopedic Surgery

## 2020-03-25 ENCOUNTER — Telehealth: Payer: Self-pay

## 2020-03-25 DIAGNOSIS — M25512 Pain in left shoulder: Secondary | ICD-10-CM

## 2020-03-25 MED ORDER — METHOCARBAMOL 500 MG PO TABS
500.0000 mg | ORAL_TABLET | Freq: Three times a day (TID) | ORAL | 0 refills | Status: DC | PRN
Start: 2020-03-25 — End: 2020-04-07

## 2020-03-25 MED ORDER — ACETAMINOPHEN-CODEINE #3 300-30 MG PO TABS
1.0000 | ORAL_TABLET | Freq: Three times a day (TID) | ORAL | 0 refills | Status: DC | PRN
Start: 2020-03-25 — End: 2020-04-03

## 2020-03-25 NOTE — Progress Notes (Signed)
   Post-Op Visit Note   Patient: Colleen Orr           Date of Birth: [birth year]           MRN: [id number] Visit Date: 03/25/2020 PCP: Joycelyn Rua, MD   Assessment & Plan:  Chief Complaint:  Chief Complaint  Patient presents with  . f/u left humerus ORIF   Visit Diagnoses:  1. Left shoulder pain, unspecified chronicity     Plan: Colleen Orr is a 59 year old patient who is now a week out left proximal humerus fracture fixation.  On exam incision is intact.  Deltoid fires.  Shoulder is predictably stiff.  I am going to have her start CPM machine next Wednesday 1 hour 3 times a day.  Discontinue sling next Wednesday but no lifting with the left arm.  Oxycodone is being taken but we will taper her to Tylenol 3 when that runs out.  Robaxin also refilled.  2-week return for clinical recheck only with no radiographs needed at that time.  I do want her to work on elbow range of motion also.  Follow-Up Instructions: Return in about 2 weeks (around 04/08/2020).   Orders:  Orders Placed This Encounter  Procedures  . XR Humerus Left   Meds ordered this encounter  Medications  . acetaminophen-codeine (TYLENOL #3) 300-30 MG tablet    Sig: Take 1 tablet by mouth every 8 (eight) hours as needed for moderate pain.    Dispense:  35 tablet    Refill:  0  . methocarbamol (ROBAXIN) 500 MG tablet    Sig: Take 1 tablet (500 mg total) by mouth every 8 (eight) hours as needed.    Dispense:  30 tablet    Refill:  0    Imaging: XR Humerus Left  Result Date: 03/25/2020 AP outlet radiographs left humerus reviewed.  Plate fixation of proximal humerus fracture in good position alignment with no complicating features.  No hardware complication.   PMFS History: Patient Active Problem List   Diagnosis Date Noted  . Right leg injury, initial encounter 09/24/2017  . Hamstring strain, left, initial encounter 05/30/2017  . Left hip pain 02/07/2014  . ABNORMALITY OF GAIT 02/10/2009  . PAIN IN  JOINT PELVIC REGION AND THIGH 12/30/2008  . Hamstring muscle strain, right, initial encounter 12/30/2008  . PATELLO-FEMORAL SYNDROME 01/28/2008   Past Medical History:  Diagnosis Date  . Anemia   . Celiac disease   . Family history of adverse reaction to anesthesia    Mother had post op N/V  . Hypothyroidism   . Thyroid disease     History reviewed. No pertinent family history.  Past Surgical History:  Procedure Laterality Date  . APPENDECTOMY    . HERNIA REPAIR    . ORIF HUMERUS FRACTURE Left 03/18/2020   Procedure: OPEN REDUCTION INTERNAL FIXATION (ORIF) PROXIMAL HUMERUS FRACTURE;  Surgeon: Cammy Copa, MD;  Location: [facility] OR;  Service: Orthopedics;  Laterality: Left;  . TUBAL LIGATION     Social History   Occupational History  . Not on file  Tobacco Use  . Smoking status: Never Smoker  . Smokeless tobacco: Never Used  Vaping Use  . Vaping Use: Never used  Substance and Sexual Activity  . Alcohol use: Yes    Alcohol/week: 7.0 standard drinks    Types: 7 Glasses of wine per week  . Drug use: Never  . Sexual activity: Not on file

## 2020-03-25 NOTE — Telephone Encounter (Signed)
Patient called she started the cmp machine was ready to be delivered today she stated she wasn't supposed to start using the machine until next Tuesday. She would like a call back.CB:[phone]

## 2020-03-26 NOTE — Telephone Encounter (Signed)
IC s/w patient advised per OV note.

## 2020-04-01 DIAGNOSIS — S42202A Unspecified fracture of upper end of left humerus, initial encounter for closed fracture: Secondary | ICD-10-CM | POA: Diagnosis not present

## 2020-04-01 DIAGNOSIS — S42292A Other displaced fracture of upper end of left humerus, initial encounter for closed fracture: Secondary | ICD-10-CM | POA: Diagnosis not present

## 2020-04-03 ENCOUNTER — Other Ambulatory Visit: Payer: Self-pay | Admitting: Surgical

## 2020-04-03 ENCOUNTER — Telehealth: Payer: Self-pay

## 2020-04-03 MED ORDER — HYDROCODONE-ACETAMINOPHEN 5-325 MG PO TABS: 1.0000 | ORAL_TABLET | Freq: Two times a day (BID) | ORAL | 0 refills | Status: DC | PRN

## 2020-04-03 NOTE — Telephone Encounter (Signed)
We can try hydrocodone instead of Tylenol #3, may help more.  I'll prescribe Norco 5mg  BID prn to her pharmacy

## 2020-04-03 NOTE — Telephone Encounter (Signed)
Patient called she stated she is tylemol 3 and she stated during the day she's okay and can manage the pain and when its time to go to bed she cant sleep because of the pain, she wants to know if she can take something different to help with pain. CB:[phone]

## 2020-04-03 NOTE — Telephone Encounter (Signed)
Patient aware of the below  

## 2020-04-06 ENCOUNTER — Telehealth: Payer: Self-pay | Admitting: Orthopedic Surgery

## 2020-04-06 NOTE — Telephone Encounter (Signed)
Patient called requesting a call back to see if she needs a refill of methocarbamol. Patient is asking a call about weather she needs another refill. Please call back at [phone].

## 2020-04-07 MED ORDER — METHOCARBAMOL 500 MG PO TABS
500.0000 mg | ORAL_TABLET | Freq: Three times a day (TID) | ORAL | 0 refills | Status: DC | PRN
Start: 2020-04-07 — End: 2020-04-13

## 2020-04-07 NOTE — Telephone Encounter (Signed)
IC s/w patient.  Submitted rxrf per Dr August Saucer

## 2020-04-08 ENCOUNTER — Ambulatory Visit (INDEPENDENT_AMBULATORY_CARE_PROVIDER_SITE_OTHER): Payer: BC Managed Care – PPO | Admitting: Orthopedic Surgery

## 2020-04-08 ENCOUNTER — Other Ambulatory Visit: Payer: Self-pay

## 2020-04-08 DIAGNOSIS — M25512 Pain in left shoulder: Secondary | ICD-10-CM

## 2020-04-12 ENCOUNTER — Encounter: Payer: Self-pay | Admitting: Orthopedic Surgery

## 2020-04-12 MED ORDER — ACETAMINOPHEN-CODEINE #3 300-30 MG PO TABS
1.0000 | ORAL_TABLET | Freq: Four times a day (QID) | ORAL | 0 refills | Status: DC | PRN
Start: 2020-04-12 — End: 2020-04-13

## 2020-04-12 NOTE — Progress Notes (Signed)
   Post-Op Visit Note   Patient: Colleen Orr           Date of Birth: 1960-07-01           MRN: [id number] Visit Date: 04/08/2020 PCP: Joycelyn Rua, MD   Assessment & Plan:  Chief Complaint:  Chief Complaint  Patient presents with  . Left Shoulder - Routine Post Op   Visit Diagnoses:  1. Left shoulder pain, unspecified chronicity     Plan: Colleen Orr is a 59 year old patient who underwent left humerus fixation 03/18/2020.  She is on CPM machine.  3 hours a day.  67 degrees.  Hard for her to sleep.  Taking Tylenol 3 before CPM.  Taking Norco at night.  Also muscle relaxer.  On exam she does have predictably stiff shoulder.  Deltoid fires.  I will have her start physical therapy next week at [facility].  3-week return for recheck on radiographs at that time.  Refill Tylenol 3.  Follow-up with me in 3 weeks for clinical recheck.  Radiographs also at that time.  Follow-Up Instructions: Return in about 3 weeks (around 04/29/2020).   Orders:  No orders of the defined types were placed in this encounter.  No orders of the defined types were placed in this encounter.   Imaging: No results found.  PMFS History: Patient Active Problem List   Diagnosis Date Noted  . Right leg injury, initial encounter 09/24/2017  . Hamstring strain, left, initial encounter 05/30/2017  . Left hip pain 02/07/2014  . ABNORMALITY OF GAIT 02/10/2009  . PAIN IN JOINT PELVIC REGION AND THIGH 12/30/2008  . Hamstring muscle strain, right, initial encounter 12/30/2008  . PATELLO-FEMORAL SYNDROME 01/28/2008   Past Medical History:  Diagnosis Date  . Anemia   . Celiac disease   . Family history of adverse reaction to anesthesia    Mother had post op N/V  . Hypothyroidism   . Thyroid disease     History reviewed. No pertinent family history.  Past Surgical History:  Procedure Laterality Date  . APPENDECTOMY    . HERNIA REPAIR    . ORIF HUMERUS FRACTURE Left 03/18/2020   Procedure: OPEN REDUCTION  INTERNAL FIXATION (ORIF) PROXIMAL HUMERUS FRACTURE;  Surgeon: Cammy Copa, MD;  Location: [facility] OR;  Service: Orthopedics;  Laterality: Left;  . TUBAL LIGATION     Social History   Occupational History  . Not on file  Tobacco Use  . Smoking status: Never Smoker  . Smokeless tobacco: Never Used  Vaping Use  . Vaping Use: Never used  Substance and Sexual Activity  . Alcohol use: Yes    Alcohol/week: 7.0 standard drinks    Types: 7 Glasses of wine per week  . Drug use: Never  . Sexual activity: Not on file

## 2020-04-13 ENCOUNTER — Other Ambulatory Visit: Payer: Self-pay | Admitting: Orthopedic Surgery

## 2020-04-13 ENCOUNTER — Other Ambulatory Visit (INDEPENDENT_AMBULATORY_CARE_PROVIDER_SITE_OTHER): Payer: Self-pay

## 2020-04-13 MED ORDER — METHOCARBAMOL 500 MG PO TABS
500.0000 mg | ORAL_TABLET | Freq: Three times a day (TID) | ORAL | 0 refills | Status: DC | PRN
Start: 2020-04-13 — End: 2023-01-10

## 2020-04-13 MED ORDER — ACETAMINOPHEN-CODEINE #3 300-30 MG PO TABS: 1.0000 | ORAL_TABLET | Freq: Three times a day (TID) | ORAL | 0 refills | Status: DC | PRN

## 2020-04-13 NOTE — Telephone Encounter (Signed)
Pls advise.  

## 2020-04-13 NOTE — Telephone Encounter (Signed)
SD pt 

## 2020-04-21 DIAGNOSIS — S42292A Other displaced fracture of upper end of left humerus, initial encounter for closed fracture: Secondary | ICD-10-CM | POA: Diagnosis not present

## 2020-04-22 DIAGNOSIS — K900 Celiac disease: Secondary | ICD-10-CM | POA: Diagnosis not present

## 2020-04-22 DIAGNOSIS — M8588 Other specified disorders of bone density and structure, other site: Secondary | ICD-10-CM | POA: Diagnosis not present

## 2020-04-22 DIAGNOSIS — M85851 Other specified disorders of bone density and structure, right thigh: Secondary | ICD-10-CM | POA: Diagnosis not present

## 2020-04-23 DIAGNOSIS — S42292A Other displaced fracture of upper end of left humerus, initial encounter for closed fracture: Secondary | ICD-10-CM | POA: Diagnosis not present

## 2020-04-25 DIAGNOSIS — S42202A Unspecified fracture of upper end of left humerus, initial encounter for closed fracture: Secondary | ICD-10-CM | POA: Diagnosis not present

## 2020-04-25 DIAGNOSIS — S42292A Other displaced fracture of upper end of left humerus, initial encounter for closed fracture: Secondary | ICD-10-CM | POA: Diagnosis not present

## 2020-04-28 DIAGNOSIS — S42292A Other displaced fracture of upper end of left humerus, initial encounter for closed fracture: Secondary | ICD-10-CM | POA: Diagnosis not present

## 2020-04-29 ENCOUNTER — Ambulatory Visit (INDEPENDENT_AMBULATORY_CARE_PROVIDER_SITE_OTHER): Payer: BC Managed Care – PPO

## 2020-04-29 ENCOUNTER — Ambulatory Visit (INDEPENDENT_AMBULATORY_CARE_PROVIDER_SITE_OTHER): Payer: BC Managed Care – PPO | Admitting: Orthopedic Surgery

## 2020-04-29 DIAGNOSIS — M25512 Pain in left shoulder: Secondary | ICD-10-CM

## 2020-04-30 DIAGNOSIS — S42292A Other displaced fracture of upper end of left humerus, initial encounter for closed fracture: Secondary | ICD-10-CM | POA: Diagnosis not present

## 2020-05-02 ENCOUNTER — Encounter: Payer: Self-pay | Admitting: Orthopedic Surgery

## 2020-05-02 NOTE — Progress Notes (Signed)
   Post-Op Visit Note   Patient: Colleen Orr           Date of Birth: 1960-10-01           MRN: [id number] Visit Date: 04/29/2020 PCP: Joycelyn Rua, MD   Assessment & Plan:  Chief Complaint:  Chief Complaint  Patient presents with  . Other    03/18/20 left humerus fixation   Visit Diagnoses:  1. Left shoulder pain, unspecified chronicity     Plan: We will send the patient is now 6 weeks out left humerus open reduction internal fixation for proximal humerus fracture.  CPM is at 85 degrees.  Off pain meds.  Sleeping in the bed.  Doing home exercise program.  Has known history of osteopenia.  Has therapy 2 times a week for the month of January.  On examination she is stiff but less stiff than she was last clinic visit.  Forward flexion abduction passively not quite yet to 90.  External rotation is about 30 degrees passively.  No real coarse grinding or crepitus present.  Radiographs look good in terms of no change in hardware position.  Plan is to really continue to work on stretching and range of motion 4 hours a day with the CPM and without at least 1 to 2 hours a day to work on stretching.  Needs to continue to do home exercise program.  Discussed how AVN sometimes can be delayed in appearance for 1 to 2 years.  Come back in 6 weeks for clinical recheck on range of motion.  No radiographs needed at that time unless she is having some mechanical symptoms. Follow-Up Instructions: Return in about 6 weeks (around 06/10/2020).   Orders:  Orders Placed This Encounter  Procedures  . XR Humerus Left   No orders of the defined types were placed in this encounter.   Imaging: No results found.  PMFS History: Patient Active Problem List   Diagnosis Date Noted  . Right leg injury, initial encounter 09/24/2017  . Hamstring strain, left, initial encounter 05/30/2017  . Left hip pain 02/07/2014  . ABNORMALITY OF GAIT 02/10/2009  . PAIN IN JOINT PELVIC REGION AND THIGH 12/30/2008   . Hamstring muscle strain, right, initial encounter 12/30/2008  . PATELLO-FEMORAL SYNDROME 01/28/2008   Past Medical History:  Diagnosis Date  . Anemia   . Celiac disease   . Family history of adverse reaction to anesthesia    Mother had post op N/V  . Hypothyroidism   . Thyroid disease     History reviewed. No pertinent family history.  Past Surgical History:  Procedure Laterality Date  . APPENDECTOMY    . HERNIA REPAIR    . ORIF HUMERUS FRACTURE Left 03/18/2020   Procedure: OPEN REDUCTION INTERNAL FIXATION (ORIF) PROXIMAL HUMERUS FRACTURE;  Surgeon: Cammy Copa, MD;  Location: [facility] OR;  Service: Orthopedics;  Laterality: Left;  . TUBAL LIGATION     Social History   Occupational History  . Not on file  Tobacco Use  . Smoking status: Never Smoker  . Smokeless tobacco: Never Used  Vaping Use  . Vaping Use: Never used  Substance and Sexual Activity  . Alcohol use: Yes    Alcohol/week: 7.0 standard drinks    Types: 7 Glasses of wine per week  . Drug use: Never  . Sexual activity: Not on file

## 2020-05-05 DIAGNOSIS — S42292A Other displaced fracture of upper end of left humerus, initial encounter for closed fracture: Secondary | ICD-10-CM | POA: Diagnosis not present

## 2020-05-07 DIAGNOSIS — S42292A Other displaced fracture of upper end of left humerus, initial encounter for closed fracture: Secondary | ICD-10-CM | POA: Diagnosis not present

## 2020-05-12 DIAGNOSIS — S42292A Other displaced fracture of upper end of left humerus, initial encounter for closed fracture: Secondary | ICD-10-CM | POA: Diagnosis not present

## 2020-05-14 DIAGNOSIS — S42292A Other displaced fracture of upper end of left humerus, initial encounter for closed fracture: Secondary | ICD-10-CM | POA: Diagnosis not present

## 2020-05-19 DIAGNOSIS — S42292A Other displaced fracture of upper end of left humerus, initial encounter for closed fracture: Secondary | ICD-10-CM | POA: Diagnosis not present

## 2020-05-22 DIAGNOSIS — S42292A Other displaced fracture of upper end of left humerus, initial encounter for closed fracture: Secondary | ICD-10-CM | POA: Diagnosis not present

## 2020-05-26 DIAGNOSIS — S42292A Other displaced fracture of upper end of left humerus, initial encounter for closed fracture: Secondary | ICD-10-CM | POA: Diagnosis not present

## 2020-05-29 DIAGNOSIS — S42292A Other displaced fracture of upper end of left humerus, initial encounter for closed fracture: Secondary | ICD-10-CM | POA: Diagnosis not present

## 2020-06-02 DIAGNOSIS — S42292A Other displaced fracture of upper end of left humerus, initial encounter for closed fracture: Secondary | ICD-10-CM | POA: Diagnosis not present

## 2020-06-05 DIAGNOSIS — S42292A Other displaced fracture of upper end of left humerus, initial encounter for closed fracture: Secondary | ICD-10-CM | POA: Diagnosis not present

## 2020-06-09 DIAGNOSIS — S42292A Other displaced fracture of upper end of left humerus, initial encounter for closed fracture: Secondary | ICD-10-CM | POA: Diagnosis not present

## 2020-06-10 ENCOUNTER — Ambulatory Visit (INDEPENDENT_AMBULATORY_CARE_PROVIDER_SITE_OTHER): Payer: BC Managed Care – PPO | Admitting: Orthopedic Surgery

## 2020-06-10 DIAGNOSIS — M25512 Pain in left shoulder: Secondary | ICD-10-CM

## 2020-06-11 DIAGNOSIS — S42292A Other displaced fracture of upper end of left humerus, initial encounter for closed fracture: Secondary | ICD-10-CM | POA: Diagnosis not present

## 2020-06-13 ENCOUNTER — Encounter: Payer: Self-pay | Admitting: Orthopedic Surgery

## 2020-06-13 NOTE — Progress Notes (Signed)
   Post-Op Visit Note   Patient: Colleen Orr           Date of Birth: 07/01/1960           MRN: [id number] Visit Date: 06/10/2020 PCP: Joycelyn Rua, MD   Assessment & Plan:  Chief Complaint:  Chief Complaint  Patient presents with  . Left Shoulder - Follow-up   Visit Diagnoses:  1. Left shoulder pain, unspecified chronicity     Plan: Colleen Orr is a 60 year old patient with left humerus fracture fixation performed 03/18/2020.  Going to physical therapy.  She has not started running yet.  She has been doing the elliptical.  Has some achiness at night.  Takes occasional Motrin and ibuprofen.  On exam she has external rotation to about 20 degrees.  Forward flexion is about 70 isolated glenohumeral abduction is also around 70-80.  Overall this is more motion than she has had.  Plan is to continue to work on range of motion exercises.  42-month return for radiographs and final clinical check.  Follow-Up Instructions: No follow-ups on file.   Orders:  No orders of the defined types were placed in this encounter.  No orders of the defined types were placed in this encounter.   Imaging: No results found.  PMFS History: Patient Active Problem List   Diagnosis Date Noted  . Right leg injury, initial encounter 09/24/2017  . Hamstring strain, left, initial encounter 05/30/2017  . Left hip pain 02/07/2014  . ABNORMALITY OF GAIT 02/10/2009  . PAIN IN JOINT PELVIC REGION AND THIGH 12/30/2008  . Hamstring muscle strain, right, initial encounter 12/30/2008  . PATELLO-FEMORAL SYNDROME 01/28/2008   Past Medical History:  Diagnosis Date  . Anemia   . Celiac disease   . Family history of adverse reaction to anesthesia    Mother had post op N/V  . Hypothyroidism   . Thyroid disease     History reviewed. No pertinent family history.  Past Surgical History:  Procedure Laterality Date  . APPENDECTOMY    . HERNIA REPAIR    . ORIF HUMERUS FRACTURE Left 03/18/2020   Procedure: OPEN  REDUCTION INTERNAL FIXATION (ORIF) PROXIMAL HUMERUS FRACTURE;  Surgeon: Cammy Copa, MD;  Location: [facility] OR;  Service: Orthopedics;  Laterality: Left;  . TUBAL LIGATION     Social History   Occupational History  . Not on file  Tobacco Use  . Smoking status: Never Smoker  . Smokeless tobacco: Never Used  Vaping Use  . Vaping Use: Never used  Substance and Sexual Activity  . Alcohol use: Yes    Alcohol/week: 7.0 standard drinks    Types: 7 Glasses of wine per week  . Drug use: Never  . Sexual activity: Not on file

## 2020-06-16 DIAGNOSIS — S42292A Other displaced fracture of upper end of left humerus, initial encounter for closed fracture: Secondary | ICD-10-CM | POA: Diagnosis not present

## 2020-06-18 DIAGNOSIS — S42292A Other displaced fracture of upper end of left humerus, initial encounter for closed fracture: Secondary | ICD-10-CM | POA: Diagnosis not present

## 2020-06-23 DIAGNOSIS — S42292A Other displaced fracture of upper end of left humerus, initial encounter for closed fracture: Secondary | ICD-10-CM | POA: Diagnosis not present

## 2020-06-25 DIAGNOSIS — S42292A Other displaced fracture of upper end of left humerus, initial encounter for closed fracture: Secondary | ICD-10-CM | POA: Diagnosis not present

## 2020-06-30 DIAGNOSIS — S42292A Other displaced fracture of upper end of left humerus, initial encounter for closed fracture: Secondary | ICD-10-CM | POA: Diagnosis not present

## 2020-07-02 DIAGNOSIS — S42292A Other displaced fracture of upper end of left humerus, initial encounter for closed fracture: Secondary | ICD-10-CM | POA: Diagnosis not present

## 2020-07-07 DIAGNOSIS — S42292A Other displaced fracture of upper end of left humerus, initial encounter for closed fracture: Secondary | ICD-10-CM | POA: Diagnosis not present

## 2020-07-09 DIAGNOSIS — S42292A Other displaced fracture of upper end of left humerus, initial encounter for closed fracture: Secondary | ICD-10-CM | POA: Diagnosis not present

## 2020-07-14 DIAGNOSIS — S42292A Other displaced fracture of upper end of left humerus, initial encounter for closed fracture: Secondary | ICD-10-CM | POA: Diagnosis not present

## 2020-07-16 DIAGNOSIS — S42292A Other displaced fracture of upper end of left humerus, initial encounter for closed fracture: Secondary | ICD-10-CM | POA: Diagnosis not present

## 2020-07-21 DIAGNOSIS — S42292A Other displaced fracture of upper end of left humerus, initial encounter for closed fracture: Secondary | ICD-10-CM | POA: Diagnosis not present

## 2020-07-23 DIAGNOSIS — S42292A Other displaced fracture of upper end of left humerus, initial encounter for closed fracture: Secondary | ICD-10-CM | POA: Diagnosis not present

## 2020-07-28 DIAGNOSIS — S42292A Other displaced fracture of upper end of left humerus, initial encounter for closed fracture: Secondary | ICD-10-CM | POA: Diagnosis not present

## 2020-07-30 DIAGNOSIS — S42292A Other displaced fracture of upper end of left humerus, initial encounter for closed fracture: Secondary | ICD-10-CM | POA: Diagnosis not present

## 2020-08-04 DIAGNOSIS — S42292A Other displaced fracture of upper end of left humerus, initial encounter for closed fracture: Secondary | ICD-10-CM | POA: Diagnosis not present

## 2020-08-06 DIAGNOSIS — S42292A Other displaced fracture of upper end of left humerus, initial encounter for closed fracture: Secondary | ICD-10-CM | POA: Diagnosis not present

## 2020-08-11 DIAGNOSIS — S42292A Other displaced fracture of upper end of left humerus, initial encounter for closed fracture: Secondary | ICD-10-CM | POA: Diagnosis not present

## 2020-08-14 DIAGNOSIS — S42292A Other displaced fracture of upper end of left humerus, initial encounter for closed fracture: Secondary | ICD-10-CM | POA: Diagnosis not present

## 2020-08-18 DIAGNOSIS — S42292A Other displaced fracture of upper end of left humerus, initial encounter for closed fracture: Secondary | ICD-10-CM | POA: Diagnosis not present

## 2020-08-20 DIAGNOSIS — S42292A Other displaced fracture of upper end of left humerus, initial encounter for closed fracture: Secondary | ICD-10-CM | POA: Diagnosis not present

## 2020-08-28 DIAGNOSIS — S42292A Other displaced fracture of upper end of left humerus, initial encounter for closed fracture: Secondary | ICD-10-CM | POA: Diagnosis not present

## 2020-09-04 DIAGNOSIS — S42292A Other displaced fracture of upper end of left humerus, initial encounter for closed fracture: Secondary | ICD-10-CM | POA: Diagnosis not present

## 2020-09-07 ENCOUNTER — Other Ambulatory Visit: Payer: Self-pay

## 2020-09-07 ENCOUNTER — Ambulatory Visit (INDEPENDENT_AMBULATORY_CARE_PROVIDER_SITE_OTHER): Payer: BC Managed Care – PPO | Admitting: Orthopedic Surgery

## 2020-09-07 ENCOUNTER — Ambulatory Visit: Payer: Self-pay

## 2020-09-07 DIAGNOSIS — M25512 Pain in left shoulder: Secondary | ICD-10-CM | POA: Diagnosis not present

## 2020-09-09 ENCOUNTER — Encounter: Payer: Self-pay | Admitting: Orthopedic Surgery

## 2020-09-09 NOTE — Progress Notes (Signed)
Office Visit Note   Patient: Colleen Orr           Date of Birth: 1961/04/22           MRN: [id number] Visit Date: 09/07/2020 Requested by: Joycelyn Rua, MD [address] [place],  [place] [address] PCP: Joycelyn Rua, MD  Subjective: Chief Complaint  Patient presents with  . Left Shoulder - Routine Post Op    HPI: Colleen Orr is a 60 y.o. female who presents to the office s/p left humerus fracture fixation on 03/18/2020.  She reports that she is doing very well and has made great progress.  She finished physical therapy last Friday.  She is not taking any medication for pain and is no longer waking with pain.  She does note occasional soreness and aching in the axilla in the mid humerus but overall this is a great improvement and continues to slowly improve.  She began jogging about 2 months ago which she feels is helped with her left shoulder pain.  She does note that her hand goes numb in the ring and small fingers when she runs but this only happens when she runs.  She does have some continued stiffness of the left shoulder that mostly bothers her with reaching behind her and when trying to reach for her seatbelt.                 ROS: All systems reviewed are negative as they relate to the chief complaint within the history of present illness.  Patient denies fevers or chills.  Assessment & Plan: Visit Diagnoses:  1. Left shoulder pain, unspecified chronicity     Plan: Patient is a 60 year old female who presents for evaluation following left humerus fracture fixation on 03/18/2020.  She finished physical therapy on Friday and has made great gains in regards to her shoulder range of motion.  She has excellent functional motion of the shoulder with no rotator cuff weakness on exam.  Radiographs show excellent fracture consolidation with hardware intact.  No backing out of the screws.  No evidence of avascular necrosis on radiographs today.  Overall patient is  pleased with her shoulder function aside from difficulty reaching behind her but she understands that it is difficult to get that motion back in particular though she will continue to work on this.  Her hand is going numb in the ring and small fingers which suggests potential ulnar nerve compression but this really only bothers her when she is running so plan to hold off on any further intervention at this time.  Recommended she follow-up with the office or call if she has more persistent numbness and tingling.  Also discussed the potential onset of avascular necrosis in the future and the signs to look for.  Patient understands.  Plan for her to follow-up with the office as needed.  Follow-Up Instructions: No follow-ups on file.   Orders:  Orders Placed This Encounter  Procedures  . XR Humerus Left   No orders of the defined types were placed in this encounter.     Procedures: No procedures performed   Clinical Data: No additional findings.  Objective: Vital Signs: There were no vitals taken for this visit.  Physical Exam:  Constitutional: Patient appears well-developed HEENT:  Head: Normocephalic Eyes:EOM are normal Neck: Normal range of motion Cardiovascular: Normal rate Pulmonary/chest: Effort normal Neurologic: Patient is alert Skin: Skin is warm Psychiatric: Patient has normal mood and affect  Ortho  Exam: Ortho exam demonstrates left shoulder with 35 degrees external rotation, 90 degrees abduction, 160 degrees forward flexion.  Incision is well-healed without any evidence of infection or dehiscence.  Axillary nerve intact with deltoid firing.  Excellent supraspinatus, infraspinatus, subscapularis strength testing.  2+ radial pulse of the left upper extremity.  Left EPL, wrist extension, finger abduction, finger adduction function intact.  No subluxing ulnar nerve.  Negative Froment's sign.  No weakness with finger abduction.  Specialty Comments:  No specialty comments  available.  Imaging: No results found.   PMFS History: Patient Active Problem List   Diagnosis Date Noted  . Right leg injury, initial encounter 09/24/2017  . Hamstring strain, left, initial encounter 05/30/2017  . Left hip pain 02/07/2014  . ABNORMALITY OF GAIT 02/10/2009  . PAIN IN JOINT PELVIC REGION AND THIGH 12/30/2008  . Hamstring muscle strain, right, initial encounter 12/30/2008  . PATELLO-FEMORAL SYNDROME 01/28/2008   Past Medical History:  Diagnosis Date  . Anemia   . Celiac disease   . Family history of adverse reaction to anesthesia    Mother had post op N/V  . Hypothyroidism   . Thyroid disease     No family history on file.  Past Surgical History:  Procedure Laterality Date  . APPENDECTOMY    . HERNIA REPAIR    . ORIF HUMERUS FRACTURE Left 03/18/2020   Procedure: OPEN REDUCTION INTERNAL FIXATION (ORIF) PROXIMAL HUMERUS FRACTURE;  Surgeon: Cammy Copa, MD;  Location: [facility] OR;  Service: Orthopedics;  Laterality: Left;  . TUBAL LIGATION     Social History   Occupational History  . Not on file  Tobacco Use  . Smoking status: Never Smoker  . Smokeless tobacco: Never Used  Vaping Use  . Vaping Use: Never used  Substance and Sexual Activity  . Alcohol use: Yes    Alcohol/week: 7.0 standard drinks    Types: 7 Glasses of wine per week  . Drug use: Never  . Sexual activity: Not on file

## 2020-09-10 ENCOUNTER — Encounter: Payer: Self-pay | Admitting: Orthopedic Surgery

## 2020-09-12 DIAGNOSIS — Z20828 Contact with and (suspected) exposure to other viral communicable diseases: Secondary | ICD-10-CM | POA: Diagnosis not present

## 2020-09-28 ENCOUNTER — Other Ambulatory Visit (HOSPITAL_BASED_OUTPATIENT_CLINIC_OR_DEPARTMENT_OTHER): Payer: Self-pay | Admitting: Family Medicine

## 2020-09-28 DIAGNOSIS — Z1231 Encounter for screening mammogram for malignant neoplasm of breast: Secondary | ICD-10-CM

## 2020-09-29 ENCOUNTER — Encounter (HOSPITAL_BASED_OUTPATIENT_CLINIC_OR_DEPARTMENT_OTHER): Payer: Self-pay

## 2020-09-29 ENCOUNTER — Ambulatory Visit (HOSPITAL_BASED_OUTPATIENT_CLINIC_OR_DEPARTMENT_OTHER)
Admission: RE | Admit: 2020-09-29 | Discharge: 2020-09-29 | Disposition: A | Discharge status: Home / Self Care | Payer: BC Managed Care – PPO | Source: Ambulatory Visit | Attending: Family Medicine | Admitting: Family Medicine

## 2020-09-29 ENCOUNTER — Other Ambulatory Visit: Payer: Self-pay

## 2020-09-29 DIAGNOSIS — Z1231 Encounter for screening mammogram for malignant neoplasm of breast: Secondary | ICD-10-CM | POA: Diagnosis not present

## 2020-10-06 ENCOUNTER — Ambulatory Visit (HOSPITAL_BASED_OUTPATIENT_CLINIC_OR_DEPARTMENT_OTHER): Payer: BC Managed Care – PPO

## 2020-11-29 DIAGNOSIS — Z20828 Contact with and (suspected) exposure to other viral communicable diseases: Secondary | ICD-10-CM | POA: Diagnosis not present

## 2020-11-29 DIAGNOSIS — Z20822 Contact with and (suspected) exposure to covid-19: Secondary | ICD-10-CM | POA: Diagnosis not present

## 2020-12-03 DIAGNOSIS — Z20822 Contact with and (suspected) exposure to covid-19: Secondary | ICD-10-CM | POA: Diagnosis not present

## 2020-12-03 DIAGNOSIS — Z03818 Encounter for observation for suspected exposure to other biological agents ruled out: Secondary | ICD-10-CM | POA: Diagnosis not present

## 2021-01-14 DIAGNOSIS — Z13220 Encounter for screening for lipoid disorders: Secondary | ICD-10-CM | POA: Diagnosis not present

## 2021-01-14 DIAGNOSIS — E039 Hypothyroidism, unspecified: Secondary | ICD-10-CM | POA: Diagnosis not present

## 2021-01-14 DIAGNOSIS — Z0000 Encounter for general adult medical examination without abnormal findings: Secondary | ICD-10-CM | POA: Diagnosis not present

## 2021-01-14 DIAGNOSIS — Z131 Encounter for screening for diabetes mellitus: Secondary | ICD-10-CM | POA: Diagnosis not present

## 2021-01-14 DIAGNOSIS — Z23 Encounter for immunization: Secondary | ICD-10-CM | POA: Diagnosis not present

## 2021-04-29 DIAGNOSIS — H2511 Age-related nuclear cataract, right eye: Secondary | ICD-10-CM | POA: Diagnosis not present

## 2021-04-29 DIAGNOSIS — H02885 Meibomian gland dysfunction left lower eyelid: Secondary | ICD-10-CM | POA: Diagnosis not present

## 2021-04-29 DIAGNOSIS — H02882 Meibomian gland dysfunction right lower eyelid: Secondary | ICD-10-CM | POA: Diagnosis not present

## 2021-06-28 DIAGNOSIS — S01112A Laceration without foreign body of left eyelid and periocular area, initial encounter: Secondary | ICD-10-CM | POA: Diagnosis not present

## 2021-07-02 DIAGNOSIS — R030 Elevated blood-pressure reading, without diagnosis of hypertension: Secondary | ICD-10-CM | POA: Diagnosis not present

## 2021-07-02 DIAGNOSIS — S01112D Laceration without foreign body of left eyelid and periocular area, subsequent encounter: Secondary | ICD-10-CM | POA: Diagnosis not present

## 2021-07-15 ENCOUNTER — Ambulatory Visit (INDEPENDENT_AMBULATORY_CARE_PROVIDER_SITE_OTHER): Payer: BC Managed Care – PPO | Admitting: Sports Medicine

## 2021-07-15 VITALS — BP 110/84 | Ht 66.0 in | Wt 128.0 lb

## 2021-07-15 DIAGNOSIS — R269 Unspecified abnormalities of gait and mobility: Secondary | ICD-10-CM

## 2021-07-15 DIAGNOSIS — M21372 Foot drop, left foot: Secondary | ICD-10-CM

## 2021-07-15 NOTE — Progress Notes (Signed)
? ?Colleen Orr is a 61 y.o. female who presents to [facility] today for the following: ? ?Balance Concerns ?Avid runner, averages about 35 miles per week ?States that she has been running for over 40 years ?First fall was in May 2019 while she was on a treadmill in [place] on vacation ?States that her left foot got caught in the tracks ?She then had another fall in November 2021 where she had ran 5 miles and fell onto concrete in the park ?States that she broke her left shoulder during that time ?Started running again March 2022 ?On March 5 of this year, she had ran about a quarter of a mile and tripped ?States that the time she was very tired from traveling ?States that every time she is falling it has always been her left foot that seems to get caught ?Also notices that sometimes when she is running her left foot will hit her right foot ?Denies any other falls ?Denies foot drop ?Denies leg weakness ?Denies numbness and tingling ?Denies any changes in her balance and day-to-day life ?Denies any urinary changes, saddle anesthesias ? ?Has a sister with neurofibromatosis  ?Minimal prior low back pain ?Sometimes has some left leg/hip burning pain ?Probably not true sciatic sxs ? ? ?PMH reviewed.  ?ROS as above. ?Medications reviewed. ? ?Exam:  ?BP 110/84   Ht 5\' 6"  (1.676 m)   Wt 128 lb (58.1 kg)   BMI 20.66 kg/m?  ?Gen: Well NAD ?Neuro: sensation intact BLE, negative finger to nose and negative heel to shin, intact double leg stance, multiple errors with single leg stance and tandem stance on left, improved on right.  Normal toe walking. ?MSK: ? ?Bilateral Lower Legs:  ?- Inspection: No gross deformity, no swelling, erythema, or ecchymosis b/l ?- Palpation: No TTP, specifically none over greater trochanter b/l ?- ROM: Normal range of motion on Flexion, extension, abduction, internal and external rotation b/l of hips ?- Strength: Normal strength in all fields b/l aside from 4/5 strength left EHL ?- Neuro/vasc: NV  intact distally b/l, 1+ patellar DTR b/l, 1+ achilles DTR b/l ?- Special Tests: Negative Trendelenberg.  Negative SLR. ? ?- Right leg length 84 cm, left 85 cm ?ASIS inferior on right compared to left ? ?- Decreased lumbar lordosis with forward pelvic tilt ? ?- Gait: Supination on L with slight swinging of the left foot that occasionally circles over midline and hits the right foot, decreased hip flexion on left ?Strikes in supination ? ?Left shoe wear is largely over the left lateral heel, right shoe wear similar in this area, but not as pronounced and some wear under the great toe as well ? ? ?No results found. ? ? ?Assessment and Plan: ?1) ABNORMALITY OF GAIT ?She seems to have a neurologic deficit within the L5-S1 distribution causing an exercise-induced foot drop.  While she does have a family history of neurofibromatosis, does not seem that she has had any problems with this in the past.  Most likely related to degenerative changes at L5-S1 versus lumbar scoliosis causing neural irritation.  We will start with lumbosacral x-rays and proceed from there.  In the meantime, she was given a lateral heel wedge and lateral post to try to help with her excessive supination on the left.  In the meantime, decrease running to ensure that she is safe, but can continue to run, just recommend on soft surfaces, not running to the point of fatigue, and decreasing her mileage during the week.  We will have her follow-up in 4 weeks to assess how she is doing at that time. ? ? ?Luis Abed, D.O.  ?PGY-4 [facility]  ?07/15/2021 5:31 PM ? ?I observed and examined the patient with the resident and agree with assessment and plan.  Note reviewed and modified by me. ?Sterling Big, MD ?

## 2021-07-15 NOTE — Patient Instructions (Signed)
Thank you for coming to see me today. It was a pleasure. Today we talked about:  ? ?Your falls seem to be from a slight weakness on your left compared to your right which is likely from nerve irritation.   ? ?We have added padding to the outside of your shoe to hopefully help with some of this so you do not land as much on the outside of your foot. ? ?You can continue to run, but please do so in a way that you are being safe.  Soft surfaces are likely better.  Do not run when you are feeling fatigued. ? ?I have placed an order for your low back.  Please go to [facility] to have this completed.  You do not need an appointment.  We will contact you with your results afterwards.  ? ?Please follow-up with [place] in 4 weeks. ? ?If you have any questions or concerns, please do not hesitate to call the office at [phone]. ? ?Best,  ? ?Luis Abed, DO ?[facility]  ?

## 2021-07-15 NOTE — Assessment & Plan Note (Signed)
She seems to have a neurologic deficit within the L5-S1 distribution causing an exercise-induced foot drop.  While she does have a family history of neurofibromatosis, does not seem that she has had any problems with this in the past.  Most likely related to degenerative changes at L5-S1 versus lumbar scoliosis causing neural irritation.  We will start with lumbosacral x-rays and proceed from there.  In the meantime, she was given a lateral heel wedge and lateral post to try to help with her excessive supination on the left.  In the meantime, decrease running to ensure that she is safe, but can continue to run, just recommend on soft surfaces, not running to the point of fatigue, and decreasing her mileage during the week.  We will have her follow-up in 4 weeks to assess how she is doing at that time. ?

## 2021-07-16 ENCOUNTER — Telehealth: Payer: Self-pay | Admitting: Family Medicine

## 2021-07-16 ENCOUNTER — Ambulatory Visit
Admission: RE | Admit: 2021-07-16 | Discharge: 2021-07-16 | Disposition: A | Discharge status: Home / Self Care | Payer: BC Managed Care – PPO | Source: Ambulatory Visit | Attending: Sports Medicine | Admitting: Sports Medicine

## 2021-07-16 DIAGNOSIS — M47816 Spondylosis without myelopathy or radiculopathy, lumbar region: Secondary | ICD-10-CM | POA: Diagnosis not present

## 2021-07-16 DIAGNOSIS — M21372 Foot drop, left foot: Secondary | ICD-10-CM

## 2021-07-16 DIAGNOSIS — M8588 Other specified disorders of bone density and structure, other site: Secondary | ICD-10-CM | POA: Diagnosis not present

## 2021-07-16 NOTE — Telephone Encounter (Signed)
Called and advised patient of normal lumar XR films.  Recommend working on balance on left foot, toe walking, and continue other plan.  She will f/u in 1 month.  Questions were answered. ?

## 2021-08-12 ENCOUNTER — Ambulatory Visit (INDEPENDENT_AMBULATORY_CARE_PROVIDER_SITE_OTHER): Payer: BC Managed Care – PPO | Admitting: Sports Medicine

## 2021-08-12 VITALS — BP 117/65 | Ht 66.0 in | Wt 125.0 lb

## 2021-08-12 DIAGNOSIS — R269 Unspecified abnormalities of gait and mobility: Secondary | ICD-10-CM

## 2021-08-12 NOTE — Assessment & Plan Note (Signed)
Overall making improvements with her exercise-induced foot drop.  Her lumbar spine films were nonrevealing, could proceed with an MRI, but given the lack of severity of her symptoms and the minimal amount of falls that have occurred, this may not be helpful.  She has made significant strides in her strength and gait since the last visit, therefore we can continue with these conservative measures.  She also has been using a lateral heel wedge and lateral post in her inserts which seems to be helpful.  She was advised that if she has any that she would like added to her H[employer] in the future, she can come in for this without charge for visit.  We will have her continue to work on her balance and peroneal strength.  We will add toe walking, heel walking, toe step ups which specifically can hopefully quantify the strength of her EHL, have her hop off of her toes.  Focus on lifting her hip while running.  We can have her gradually start to increase running on roads, but starting slowly to ensure that she is safe.  Advised to watch while she is downhill running as this can tire her tibialis anterior and lead to a foot drop.  She can follow-up as needed if she is having any progressive weakness or numbness in her lower extremities. ?

## 2021-08-12 NOTE — Patient Instructions (Signed)
Thank you for coming to see me today. It was a pleasure. Today we talked about:  ? ?Your strength is improving - great job!  We will continue to work on this by having you continue your current exercises and adding toe exercises.  If this is too much to do in one day, you can do them every other day.  You can start road running, but make sure you are careful with downhill running as this can tire one of the muscles that can make your foot drop.  Start slow on roads and gradually increase to make sure you are ready. ? ?Please follow-up with [place] if you have any concerns or questions.  If you experience progressive weakness or numbness in your legs, you should come back. ? ?If you have any questions or concerns, please do not hesitate to call the office at [phone]. ? ?Best,  ? ?Luis Abed, DO ?[facility]  ?

## 2021-08-12 NOTE — Progress Notes (Signed)
? ?  Colleen Orr is a 61 y.o. female who presents to [facility] today for the following: ? ?Left exercise-induced foot drop ?Last seen for this on 3/23 ?There were balance concerns where patient had tripped while running a few times, mostly during times of fatigue ?On exam she had 4-5 strength in left EHL and gait with supination on left with slight swinging of left foot that occasionally circled over midline and hit the right foot with decreased hip flexion on the left as well ?Would strike and supination ?Lumbar spine x-rays showed very mild lumbar facet arthropathy ?She has been working diligently at balance exercises and peroneal tendon strengthening exercises ?She has been on the track a few times and gotten up to 5 miles ?She has not had any further tripping events ?She feels that her strength is improving ? ? ?PMH reviewed.  ?ROS as above. ?Medications reviewed. ? ?Exam:  ?BP 117/65   Ht 5\' 6"  (1.676 m)   Wt 125 lb (56.7 kg)   BMI 20.18 kg/m?  ?Gen: Well NAD ?MSK: ?Bilateral lower legs ?- Inspection: no gross deformity or asymmetry, swelling or ecchymosis ?- Strength: 5/5 strength of lower extremity with very minimal decrease in strength of left EHL compared to right, improved from last exam ?- Neuro: NV intact distally bilaterally ?- Special testing: Negative Trendelenburg, improved balance with 1 leg stance and reach ? ?Gait: Continues have slightly decreased hip flexion on left, but improved from previous.  She has significantly decreased left foot circling. ? ? ?Assessment and Plan: ?1) ABNORMALITY OF GAIT ?Overall making improvements with her exercise-induced foot drop.  Her lumbar spine films were nonrevealing, could proceed with an MRI, but given the lack of severity of her symptoms and the minimal amount of falls that have occurred, this may not be helpful.  She has made significant strides in her strength and gait since the last visit, therefore we can continue with these conservative measures.  She  also has been using a lateral heel wedge and lateral post in her inserts which seems to be helpful.  She was advised that if she has any that she would like added to her Hapads in the future, she can come in for this without charge for visit.  We will have her continue to work on her balance and peroneal strength.  We will add toe walking, heel walking, toe step ups which specifically can hopefully quantify the strength of her EHL, have her hop off of her toes.  Focus on lifting her hip while running.  We can have her gradually start to increase running on roads, but starting slowly to ensure that she is safe.  Advised to watch while she is downhill running as this can tire her tibialis anterior and lead to a foot drop.  She can follow-up as needed if she is having any progressive weakness or numbness in her lower extremities. ? ? ?Arizona Constable, D.O.  ?PGY-4 [facility]  ?08/12/2021 4:47 PM ? ?I observed and examined the patient with the resident and agree with assessment and plan.  Note reviewed and modified by me. ?Ila Mcgill, MD ?

## 2021-11-09 ENCOUNTER — Other Ambulatory Visit (HOSPITAL_BASED_OUTPATIENT_CLINIC_OR_DEPARTMENT_OTHER): Payer: Self-pay | Admitting: Family Medicine

## 2021-11-09 DIAGNOSIS — Z1231 Encounter for screening mammogram for malignant neoplasm of breast: Secondary | ICD-10-CM

## 2021-11-10 ENCOUNTER — Ambulatory Visit (HOSPITAL_BASED_OUTPATIENT_CLINIC_OR_DEPARTMENT_OTHER)
Admission: RE | Admit: 2021-11-10 | Discharge: 2021-11-10 | Disposition: A | Discharge status: Home / Self Care | Payer: BC Managed Care – PPO | Source: Ambulatory Visit | Attending: Family Medicine | Admitting: Family Medicine

## 2021-11-10 ENCOUNTER — Encounter (HOSPITAL_BASED_OUTPATIENT_CLINIC_OR_DEPARTMENT_OTHER): Payer: Self-pay

## 2021-11-10 DIAGNOSIS — Z1231 Encounter for screening mammogram for malignant neoplasm of breast: Secondary | ICD-10-CM | POA: Diagnosis not present

## 2021-11-12 ENCOUNTER — Other Ambulatory Visit: Payer: Self-pay | Admitting: Family Medicine

## 2021-11-12 DIAGNOSIS — R928 Other abnormal and inconclusive findings on diagnostic imaging of breast: Secondary | ICD-10-CM

## 2021-11-19 ENCOUNTER — Ambulatory Visit
Admission: RE | Admit: 2021-11-19 | Discharge: 2021-11-19 | Disposition: A | Discharge status: Home / Self Care | Payer: BC Managed Care – PPO | Source: Ambulatory Visit | Attending: Family Medicine | Admitting: Family Medicine

## 2021-11-19 ENCOUNTER — Ambulatory Visit: Admission: RE | Admit: 2021-11-19 | Payer: BC Managed Care – PPO | Source: Ambulatory Visit

## 2021-11-19 DIAGNOSIS — R928 Other abnormal and inconclusive findings on diagnostic imaging of breast: Secondary | ICD-10-CM

## 2021-11-19 DIAGNOSIS — R922 Inconclusive mammogram: Secondary | ICD-10-CM | POA: Diagnosis not present

## 2022-04-04 DIAGNOSIS — Z131 Encounter for screening for diabetes mellitus: Secondary | ICD-10-CM | POA: Diagnosis not present

## 2022-04-04 DIAGNOSIS — Z23 Encounter for immunization: Secondary | ICD-10-CM | POA: Diagnosis not present

## 2022-04-04 DIAGNOSIS — E039 Hypothyroidism, unspecified: Secondary | ICD-10-CM | POA: Diagnosis not present

## 2022-04-04 DIAGNOSIS — Z0000 Encounter for general adult medical examination without abnormal findings: Secondary | ICD-10-CM | POA: Diagnosis not present

## 2022-04-04 DIAGNOSIS — E7800 Pure hypercholesterolemia, unspecified: Secondary | ICD-10-CM | POA: Diagnosis not present

## 2022-04-20 IMAGING — CR DG LUMBAR SPINE 2-3V
3 series · 3 of 3 positions shown · non-contrast
Comparison: None.

CLINICAL DATA: Tightness bilateral lower back, buttocks, thighs.
Left foot drop.

EXAM:
LUMBAR SPINE - 2-3 VIEW

[t l-spine a.p.]
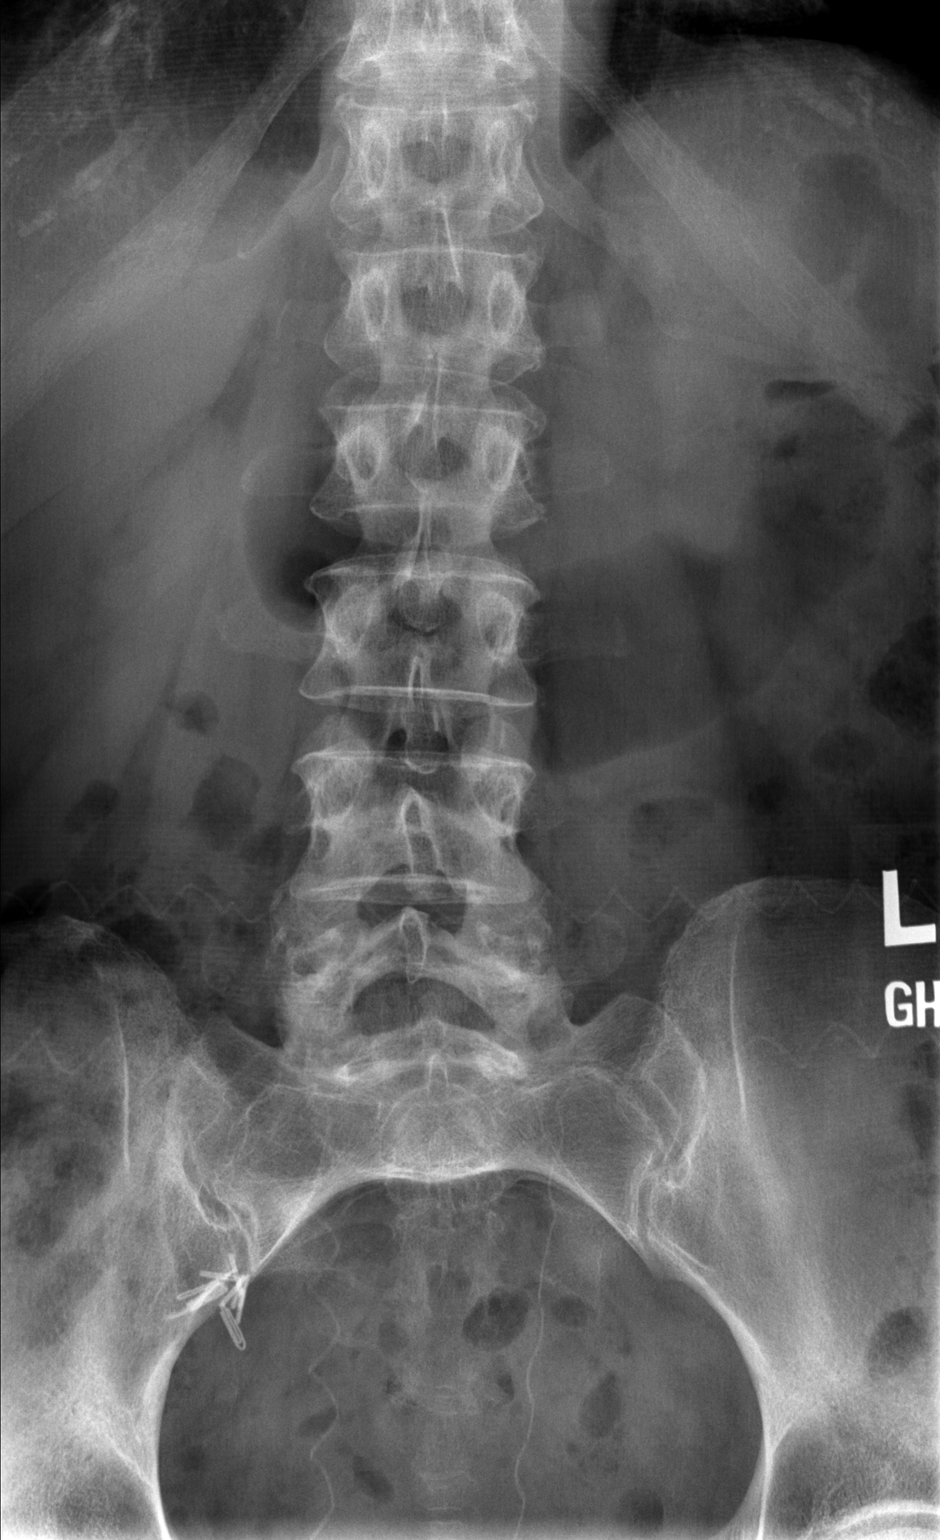

[t l-spine lat]
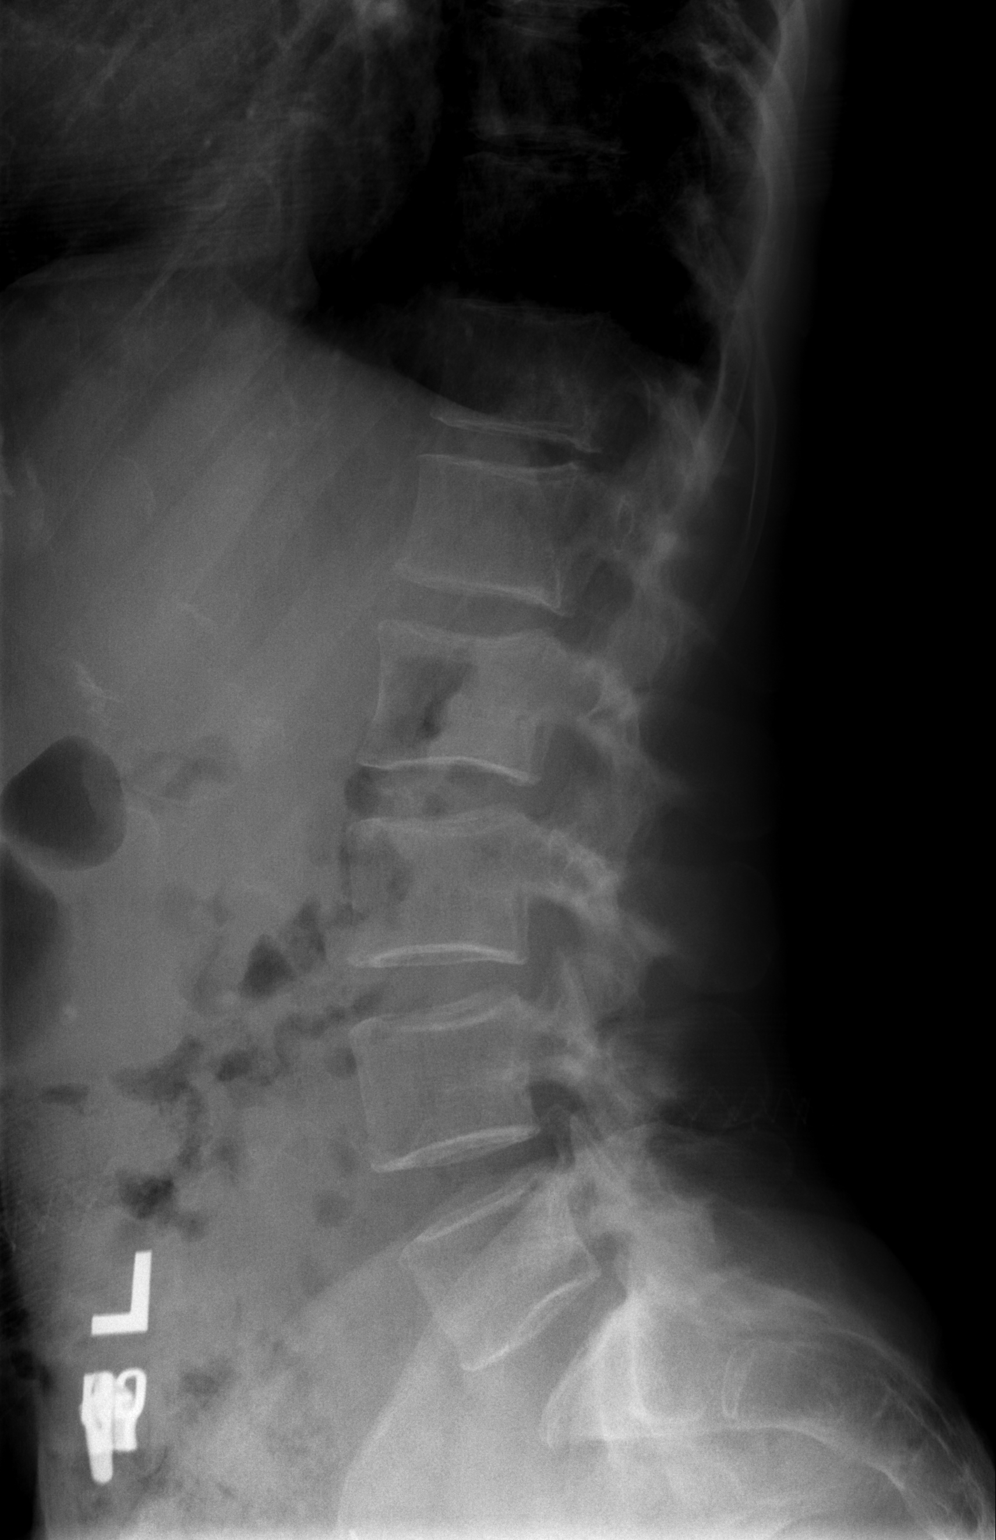

[t l-spine l5-s1 spot]
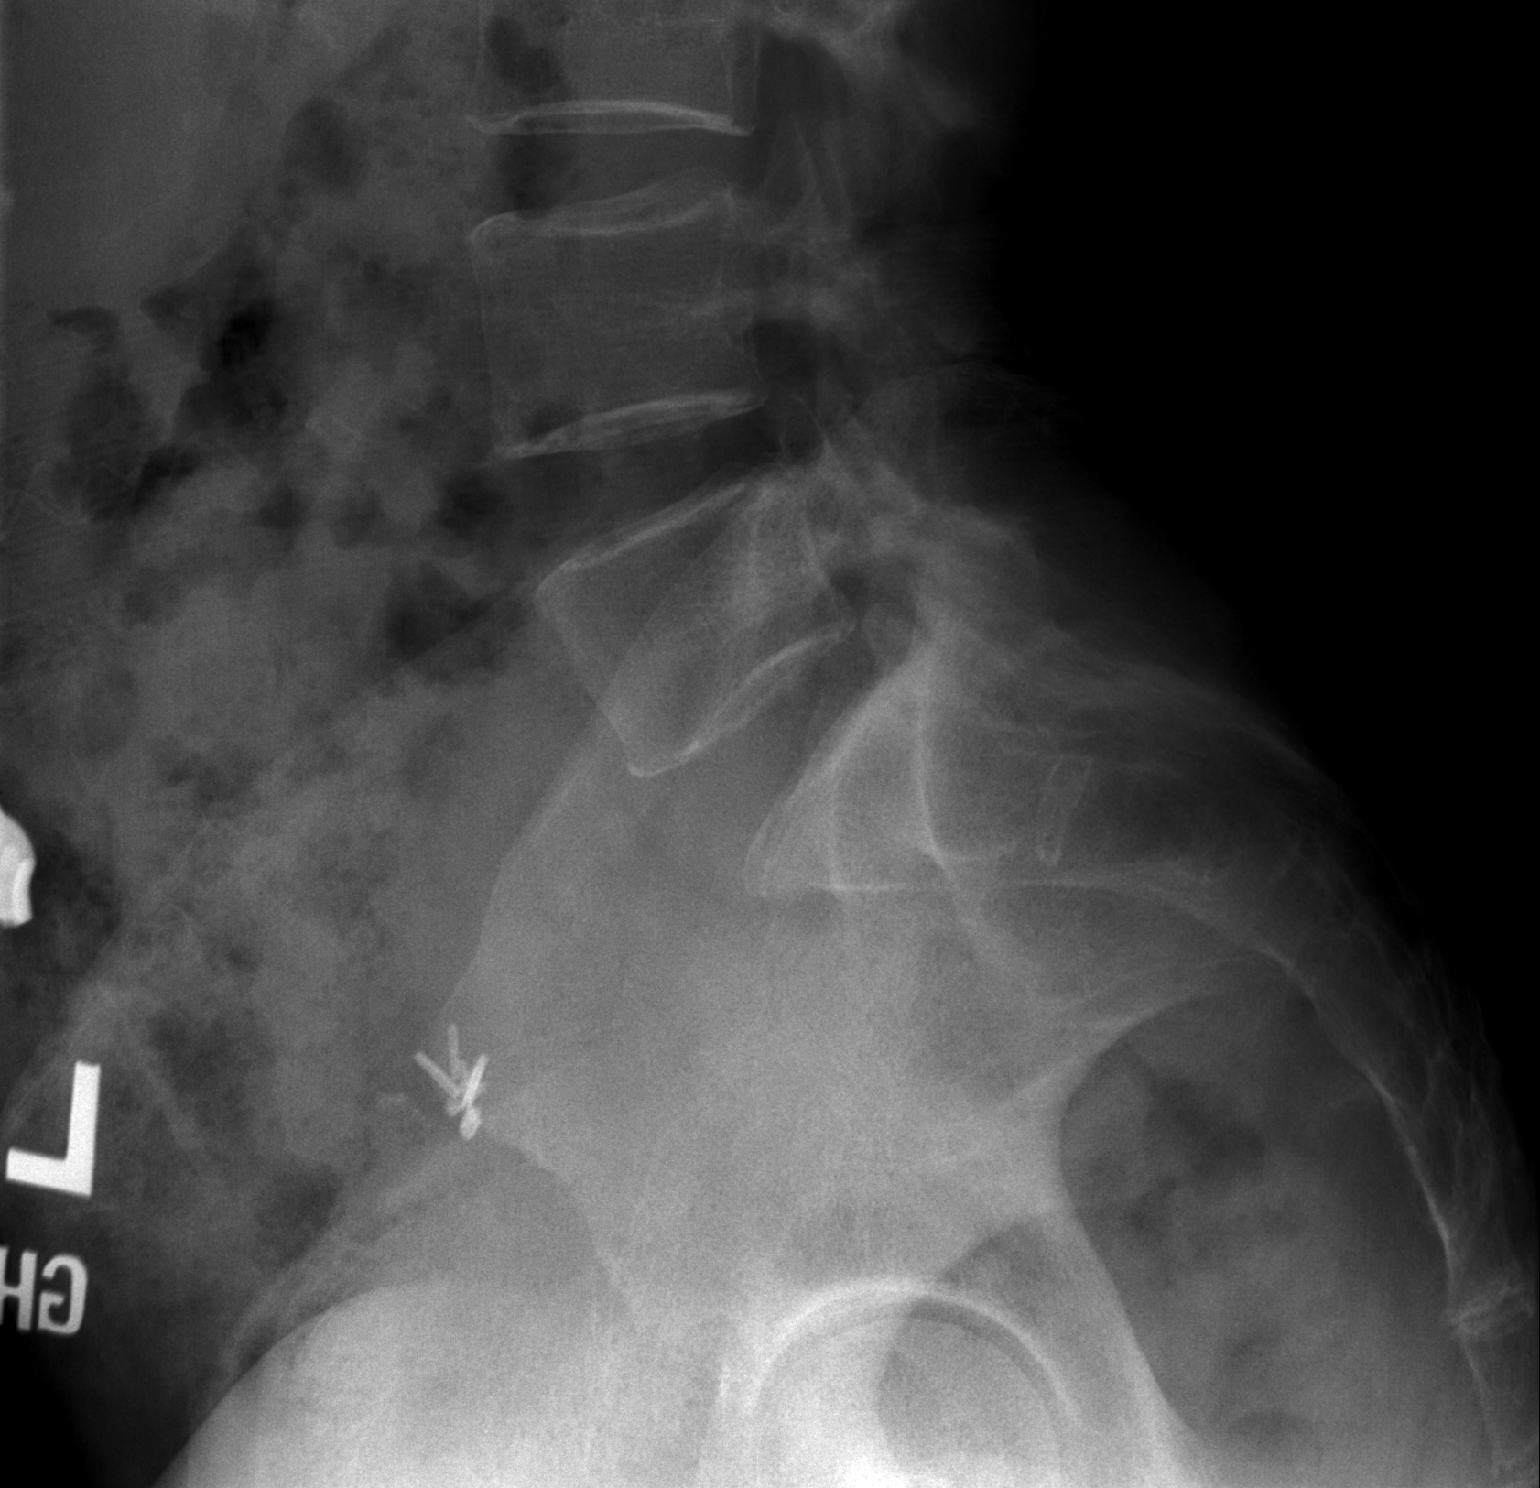

[3 of 3 positions shown; findings below may reference images not displayed]

FINDINGS: Five lumbar type vertebra. There is no acute fracture or
subluxation. There is mild osteopenia and mild degenerative changes.
Lower lumbar facet arthropathy. The visualized posterior elements
are intact. Multiple surgical clips noted over the right pelvis. The
soft tissues are unremarkable.
IMPRESSION: 1. No acute/traumatic lumbar spine pathology.
2. Mild degenerative changes with lower lumbar facet arthropathy.

## 2022-08-01 ENCOUNTER — Other Ambulatory Visit (HOSPITAL_COMMUNITY): Payer: BC Managed Care – PPO

## 2022-08-16 ENCOUNTER — Ambulatory Visit (INDEPENDENT_AMBULATORY_CARE_PROVIDER_SITE_OTHER): Payer: BC Managed Care – PPO | Admitting: Sports Medicine

## 2022-08-16 VITALS — BP 114/70 | Ht 66.0 in | Wt 127.0 lb

## 2022-08-16 DIAGNOSIS — R269 Unspecified abnormalities of gait and mobility: Secondary | ICD-10-CM

## 2022-08-16 DIAGNOSIS — R55 Syncope and collapse: Secondary | ICD-10-CM

## 2022-08-16 NOTE — Progress Notes (Signed)
Established Patient Office Visit  Subjective   Patient ID: Colleen Orr, female    DOB: 07-14-1960  Age: 62 y.o. MRN: [id number]  Falls.  She is here today for follow-up on recent falls while running. She has been evaluated by [place] in the past most recently back in April 2023 and diagnosed with exercise-induced foot drop on the left side.  Since then she Had diligently working on some home exercises and strengthening.  She did return to running for a period of time paying close attention to her gait.  She is also given insoles with 1/5 ray post and a lateral heel wedge.  This seemed to reduce her number of falls until she fell back in October.  She was approximate 1 mile into her run and she was feeling comfortable and not focusing on running gait and again before she knew that she was on the ground.  She hit her jaw scraped up her hands.  She denies any loss of consciousness, dizziness or presyncopal feelings.  Her family is quite upset with her as she continues to run but is having falls.  She has discontinued running since October and has been doing inside, peloton bike rides.Of note her garment tracks or resting heart rate to be approximately 39 bpm.  She is able to increase her heart rate to approximate 140 while exercising on the bike.  Occasionally she will notice heart rate of 140 after exercise without palpitations.  She denies any chest pain or shortness of breath.   ROS as listed above in HPI    Objective:     BP 114/70   Ht  (1.676 m)   Wt 127 lb (57.6 kg)   BMI 20.50 kg/m   Physical Exam Vitals reviewed.  Constitutional:      General: She is not in acute distress.    Appearance: Normal appearance. She is not ill-appearing, toxic-appearing or diaphoretic.  Cardiovascular:     Rate and Rhythm: Bradycardia present.     Pulses: Normal pulses.  Skin:    General: Skin is warm.  Neurological:     Mental Status: She is alert.   Left foot: No obvious deformity or  asymmetry.  Large callus plantar surface under the fifth metatarsal.  No splaying of her toes.  Full range of motion at the ankle plantar flexion, dorsiflexion, inversion and eversion.  Strength 5/5 resisted plantar flexion, dorsiflexion and inversion and eversion.  Great toe extension 5/5 bilaterally.  Patellar and Achilles reflex 2/4.  Normal gait    Assessment & Plan:   Problem List Items Addressed This Visit       Other   Abnormality of gait - Primary    Patient has discontinued running secondary to another fall back in October in which she hit her head.  Patient is unclear exactly on how she fell while running.  She does deny any presyncopal dizziness or chest pain while running.  Patient has improved weakness that was previously seen last year.  She was previously diagnosed with exercise-induced foot drop however unsure on if this etiology was again the cause of her fall.  Do not currently recommend return to running without cardiac workup and evaluation  Recommend starting with a Holter monitor, With likely progression to exercise treadmill testing via cardiology department. Another consideration would be MRI of the lumbar spine for further investigation of possible weakness and foot drop.  Patient verbalized understanding.  She would hold off on running at this time.  We will set up a Holter monitor when she returns from her trip to [place].  Will follow up after Holter monitor is complete.       Return in about 4 weeks (around 09/13/2022).    Claudie Leach, DO  I observed and examined the patient with the [facility] resident and agree with assessment and plan.  Note reviewed and modified by me. Sterling Big, MD

## 2022-08-16 NOTE — Assessment & Plan Note (Signed)
Patient has discontinued running secondary to another fall back in October in which she hit her head.  Patient is unclear exactly on how she fell while running.  She does deny any presyncopal dizziness or chest pain while running.  Patient has improved weakness that was previously seen last year.  She was previously diagnosed with exercise-induced foot drop however unsure on if this etiology was again the cause of her fall.  Do not currently recommend return to running without cardiac workup and evaluation  Recommend starting with a Holter monitor, With likely progression to exercise treadmill testing via cardiology department. Another consideration would be MRI of the lumbar spine for further investigation of possible weakness and foot drop.  Patient verbalized understanding.  She would hold off on running at this time.  We will set up a Holter monitor when she returns from her trip to [place].  Will follow up after Holter monitor is complete.

## 2022-08-17 ENCOUNTER — Encounter: Payer: Self-pay | Admitting: *Deleted

## 2022-09-01 ENCOUNTER — Encounter: Payer: Self-pay | Admitting: *Deleted

## 2022-09-01 NOTE — Addendum Note (Signed)
Addended by: Annita Brod on: 09/01/2022 09:01 AM   Modules accepted: Orders

## 2022-09-05 DIAGNOSIS — R55 Syncope and collapse: Secondary | ICD-10-CM | POA: Insufficient documentation

## 2022-09-05 NOTE — Addendum Note (Signed)
Addended by: Annita Brod on: 09/05/2022 02:52 PM   Modules accepted: Orders

## 2022-10-25 ENCOUNTER — Ambulatory Visit: Payer: BC Managed Care – PPO | Admitting: Cardiovascular Disease

## 2022-11-10 ENCOUNTER — Other Ambulatory Visit: Payer: Self-pay | Admitting: Family Medicine

## 2022-11-10 DIAGNOSIS — Z1231 Encounter for screening mammogram for malignant neoplasm of breast: Secondary | ICD-10-CM

## 2022-11-15 ENCOUNTER — Ambulatory Visit: Payer: BC Managed Care – PPO

## 2022-11-15 DIAGNOSIS — Z1231 Encounter for screening mammogram for malignant neoplasm of breast: Secondary | ICD-10-CM

## 2022-11-15 DIAGNOSIS — E7800 Pure hypercholesterolemia, unspecified: Secondary | ICD-10-CM | POA: Diagnosis not present

## 2023-01-09 ENCOUNTER — Encounter: Payer: Self-pay | Admitting: Cardiovascular Disease

## 2023-01-09 NOTE — Progress Notes (Unsigned)
Cardiology Office Note:  .   Date:  01/10/2023  ID:  Colleen Orr, DOB March 14, 1961, MRN [id number] PCP: Joycelyn Rua, MD  [facility] Providers Cardiologist:   Xzander Gilham  Click to update primary MD,subspecialty MD or APP then REFRESH:1}   History of Present Illness: .   Colleen Orr is a 62 y.o. female with a hx of palpitations  Has had several falls over the past several years   Was a runner , has fallen several times while running  She is here to evaluate her slow HR   She has a [employer] watch that indicated that her HR is slow - has found HR of 39    She knows that she trips / catches her left foot but on  one occasion she does not recall tripping but just saw the ground coming toward her face.   Has run marathons Typically runs 40 miles a week   No CP , no dyspnea  Has occasional orhtostatic hypotension    ROS:    Studies Reviewed: Marland Kitchen     EKG Interpretation Date/Time:  Tuesday January 10 2023 14:44:53 EDT Ventricular Rate:  60 PR Interval:  186 QRS Duration:  96 QT Interval:  452 QTC Calculation: 452 R Axis:   102  Text Interpretation: Normal sinus rhythm Rightward axis Incomplete right bundle branch block No previous ECGs available Confirmed by Kristeen Miss ([id number]) on 01/10/2023 2:57:21 PM     Risk Assessment/Calculations:             Physical Exam:   VS:  BP 128/88   Pulse 62   Ht 5\' 6"  (1.676 m)   Wt 132 lb 6.4 oz (60.1 kg)   SpO2 100%   BMI 21.37 kg/m    Wt Readings from Last 3 Encounters:  01/10/23 132 lb 6.4 oz (60.1 kg)  08/16/22 127 lb (57.6 kg)  08/12/21 125 lb (56.7 kg)    GEN: Well nourished, well developed in no acute distress NECK: No JVD; No carotid bruits CARDIAC: RR , soft systolic murmur radiating to below her left breast  RESPIRATORY:  Clear to auscultation without rales, wheezing or rhonchi  ABDOMEN: Soft, non-tender, non-distended EXTREMITIES:  No edema; No deformity   ASSESSMENT AND PLAN: .   Possible  syncope: [name] presents after several falling episodes.  On some occasion she knows that she trips with her foot but on other occasions she just wants up falling to the ground.  She is an avid runner.  She notes that her resting heart rate at night is typically in the 30s.  We discussed the fact that she probably has high vagal tone and that slow heart rate would be normal for her.  I would like to place a 3-day monitor on her to assess whether she has an arrhythmia that is contributing to her falls.  2.  Mitral regurgitation.  She has mitral regurgitation on exam.  I would like to get an echocardiogram.  She has a high deductible plan so she may elect not to get the echocardiogram at this time.  I have reassured her that the mitral regurgitation sounds mild and I think we can easily wait until she turns 63 before getting an echocardiogram.  I will see her in April 2025.       Dispo: 8-9 month follow up   Signed, Kristeen Miss, MD

## 2023-01-10 ENCOUNTER — Ambulatory Visit: Payer: BC Managed Care – PPO | Attending: Cardiovascular Disease | Admitting: Cardiovascular Disease

## 2023-01-10 ENCOUNTER — Ambulatory Visit (INDEPENDENT_AMBULATORY_CARE_PROVIDER_SITE_OTHER): Payer: BC Managed Care – PPO

## 2023-01-10 ENCOUNTER — Telehealth: Payer: Self-pay | Admitting: *Deleted

## 2023-01-10 ENCOUNTER — Encounter: Payer: Self-pay | Admitting: Cardiovascular Disease

## 2023-01-10 VITALS — BP 128/88 | HR 62 | Ht 66.0 in | Wt 132.4 lb

## 2023-01-10 DIAGNOSIS — I340 Nonrheumatic mitral (valve) insufficiency: Secondary | ICD-10-CM

## 2023-01-10 DIAGNOSIS — R55 Syncope and collapse: Secondary | ICD-10-CM | POA: Diagnosis not present

## 2023-01-10 NOTE — Progress Notes (Unsigned)
Enrolled for [employer] to mail a ZIO XT long term holter monitor to the patients address on file.

## 2023-01-10 NOTE — Patient Instructions (Signed)
Medication Instructions:   Your physician recommends that you continue on your current medications as directed. Please refer to the Current Medication list given to you today.  *If you need a refill on your cardiac medications before your next appointment, please call your pharmacy*   Testing/Procedures:  Your physician has requested that you have an echocardiogram. Echocardiography is a painless test that uses sound waves to create images of your heart. It provides your doctor with information about the size and shape of your heart and how well your heart's chambers and valves are working. This procedure takes approximately one hour. There are no restrictions for this procedure.  PATIENT WANTS TO CHECK THE COST OF THIS TEST WITH CHECKOUT BEFORE SCHEDULING--IF THE COST OF THE ECHO IS TOO EXPENSIVE TO HAVE DONE NOW, PLEASE SCHEDULE THE PATIENT TO HAVE THIS DONE IN APRIL 2025 WHEN SHE GETS MEDICARE  Please do NOT wear cologne, perfume, aftershave, or lotions (deodorant is allowed). Please arrive 15 minutes prior to your appointment time.     ZIO XT- Long Term Monitor Instructions  Your physician has requested you wear a ZIO patch monitor for 3 days.  This is a single patch monitor. [employer] supplies one patch monitor per enrollment. Additional stickers are not available. Please do not apply patch if you will be having a Nuclear Stress Test,  Echocardiogram, Cardiac CT, MRI, or Chest Xray during the period you would be wearing the  monitor. The patch cannot be worn during these tests. You cannot remove and re-apply the  ZIO XT patch monitor.  Your ZIO patch monitor will be mailed 3 day USPS to your address on file. It may take 3-5 days  to receive your monitor after you have been enrolled.  Once you have received your monitor, please review the enclosed instructions. Your monitor  has already been registered assigning a specific monitor serial # to you.  Billing and Patient Assistance  Program Information  We have supplied [employer] with any of your insurance information on file for billing purposes. [employer] offers a sliding scale Patient Assistance Program for patients that do not have  insurance, or whose insurance does not completely cover the cost of the ZIO monitor.  You must apply for the Patient Assistance Program to qualify for this discounted rate.  To apply, please call [employer] at [phone], select option 4, select option 2, ask to apply for  Patient Assistance Program. Meredeth Ide will ask your household income, and how many people  are in your household. They will quote your out-of-pocket cost based on that information.  [employer] will also be able to set up a 28-month, interest-free payment plan if needed.  Applying the monitor   Shave hair from upper left chest.  Hold abrader disc by orange tab. Rub abrader in 40 strokes over the upper left chest as  indicated in your monitor instructions.  Clean area with 4 enclosed alcohol pads. Let dry.  Apply patch as indicated in monitor instructions. Patch will be placed under collarbone on left  side of chest with arrow pointing upward.  Rub patch adhesive wings for 2 minutes. Remove white label marked "1". Remove the white  label marked "2". Rub patch adhesive wings for 2 additional minutes.  While looking in a mirror, press and release button in center of patch. A small green light will  flash 3-4 times. This will be your only indicator that the monitor has been turned on.  Do not shower for the first 24 hours. You  may shower after the first 24 hours.  Press the button if you feel a symptom. You will hear a small click. Record Date, Time and  Symptom in the Patient Logbook.  When you are ready to remove the patch, follow instructions on the last 2 pages of Patient  Logbook. Stick patch monitor onto the last page of Patient Logbook.  Place Patient Logbook in the blue and white box. Use locking tab on box and tape box  closed  securely. The blue and white box has prepaid postage on it. Please place it in the mailbox as  soon as possible. Your physician should have your test results approximately 7 days after the  monitor has been mailed back to [facility].  Call [facility] Customer Care at [phone] if you have questions regarding  your ZIO XT patch monitor. Call them immediately if you see an orange light blinking on your  monitor.  If your monitor falls off in less than 4 days, contact our Monitor department at [phone].  If your monitor becomes loose or falls off after 4 days call [employer] at [phone] for  suggestions on securing your monitor    Follow-Up:  IN APRIL 2025 WITH DR. Elease Hashimoto

## 2023-01-10 NOTE — Telephone Encounter (Signed)
-----   Message from [place] W sent at 01/10/2023  3:44 PM EDT ----- Regarding: RE: 3 DAY ZIO PER DR. Elease Hashimoto done ----- Message ----- From: Loa Socks, LPN Sent: 1/61/0960   3:25 PM EDT To: Ernst Bowler; Katrina Claria Dice Subject: 3 DAY ZIO PER DR. Elease Hashimoto                       3 day zio ordered for syncope per Dr. Elease Hashimoto  Please enroll and let me know when you do  Thanks,  Lajoyce Corners

## 2023-01-13 DIAGNOSIS — R55 Syncope and collapse: Secondary | ICD-10-CM | POA: Diagnosis not present

## 2023-01-18 DIAGNOSIS — R55 Syncope and collapse: Secondary | ICD-10-CM | POA: Diagnosis not present

## 2023-01-23 ENCOUNTER — Telehealth: Payer: Self-pay | Admitting: Cardiovascular Disease

## 2023-01-23 NOTE — Telephone Encounter (Signed)
Pt calling to go over monitor results. She states she did review via mychart but had a couple f/u questions

## 2023-01-24 NOTE — Telephone Encounter (Signed)
Called and spoke with patient regarding below monitor results. Answered all questions.    Predominate rhythm is sinus   Rare PACs,   Rare PVCs   No serious arrhythmias observed     Patch Wear Time:  3 days and 0 hours (2024-09-20T12:44:48-0400 to 2024-09-23T13:31:33-0400)   Patient had a min HR of 32 bpm, max HR of 148 bpm, and avg HR of 54 bpm. Predominant underlying rhythm was Sinus Rhythm. Isolated SVEs were rare (<1.0%), SVE Couplets were rare (<1.0%), and SVE Triplets were rare (<1.0%). Isolated VEs were rare (<1.0%),  and no VE Couplets or VE Triplets were present.

## 2023-04-12 DIAGNOSIS — E785 Hyperlipidemia, unspecified: Secondary | ICD-10-CM | POA: Diagnosis not present

## 2023-04-12 DIAGNOSIS — E039 Hypothyroidism, unspecified: Secondary | ICD-10-CM | POA: Diagnosis not present

## 2023-04-12 DIAGNOSIS — Z0000 Encounter for general adult medical examination without abnormal findings: Secondary | ICD-10-CM | POA: Diagnosis not present

## 2023-07-31 ENCOUNTER — Other Ambulatory Visit (HOSPITAL_COMMUNITY): Payer: BC Managed Care – PPO

## 2023-10-20 ENCOUNTER — Other Ambulatory Visit (HOSPITAL_BASED_OUTPATIENT_CLINIC_OR_DEPARTMENT_OTHER): Payer: Self-pay | Admitting: Family Medicine

## 2023-10-20 DIAGNOSIS — M8580 Other specified disorders of bone density and structure, unspecified site: Secondary | ICD-10-CM

## 2023-11-13 ENCOUNTER — Other Ambulatory Visit (HOSPITAL_BASED_OUTPATIENT_CLINIC_OR_DEPARTMENT_OTHER): Payer: Self-pay | Admitting: Family Medicine

## 2023-11-13 DIAGNOSIS — Z1231 Encounter for screening mammogram for malignant neoplasm of breast: Secondary | ICD-10-CM

## 2023-11-24 ENCOUNTER — Ambulatory Visit (HOSPITAL_BASED_OUTPATIENT_CLINIC_OR_DEPARTMENT_OTHER)
Admission: RE | Admit: 2023-11-24 | Discharge: 2023-11-24 | Disposition: A | Discharge status: Home / Self Care | Source: Ambulatory Visit | Attending: Family Medicine | Admitting: Family Medicine

## 2023-11-24 ENCOUNTER — Encounter (HOSPITAL_BASED_OUTPATIENT_CLINIC_OR_DEPARTMENT_OTHER): Payer: Self-pay | Admitting: Radiology

## 2023-11-24 DIAGNOSIS — Z1231 Encounter for screening mammogram for malignant neoplasm of breast: Secondary | ICD-10-CM | POA: Diagnosis present

## 2023-11-29 ENCOUNTER — Other Ambulatory Visit: Payer: Self-pay | Admitting: Family Medicine

## 2023-11-29 DIAGNOSIS — R928 Other abnormal and inconclusive findings on diagnostic imaging of breast: Secondary | ICD-10-CM

## 2023-12-05 ENCOUNTER — Ambulatory Visit
Admission: RE | Admit: 2023-12-05 | Discharge: 2023-12-05 | Disposition: A | Discharge status: Home / Self Care | Source: Ambulatory Visit | Attending: Family Medicine | Admitting: Family Medicine

## 2023-12-05 ENCOUNTER — Other Ambulatory Visit: Payer: Self-pay | Admitting: Family Medicine

## 2023-12-05 DIAGNOSIS — R928 Other abnormal and inconclusive findings on diagnostic imaging of breast: Secondary | ICD-10-CM

## 2023-12-06 ENCOUNTER — Other Ambulatory Visit (HOSPITAL_BASED_OUTPATIENT_CLINIC_OR_DEPARTMENT_OTHER)

## 2023-12-20 ENCOUNTER — Ambulatory Visit (HOSPITAL_BASED_OUTPATIENT_CLINIC_OR_DEPARTMENT_OTHER)
Admission: RE | Admit: 2023-12-20 | Discharge: 2023-12-20 | Disposition: A | Discharge status: Home / Self Care | Source: Ambulatory Visit | Attending: Family Medicine | Admitting: Family Medicine

## 2023-12-20 DIAGNOSIS — M8589 Other specified disorders of bone density and structure, multiple sites: Secondary | ICD-10-CM | POA: Insufficient documentation

## 2023-12-20 DIAGNOSIS — Z13820 Encounter for screening for osteoporosis: Secondary | ICD-10-CM | POA: Insufficient documentation

## 2023-12-20 DIAGNOSIS — M8580 Other specified disorders of bone density and structure, unspecified site: Secondary | ICD-10-CM | POA: Diagnosis present

## 2024-02-15 ENCOUNTER — Telehealth: Payer: Self-pay | Admitting: Physician Assistant

## 2024-02-15 DIAGNOSIS — I340 Nonrheumatic mitral (valve) insufficiency: Secondary | ICD-10-CM

## 2024-02-15 NOTE — Telephone Encounter (Signed)
 Pt had order for Echo from [facility] but never went. Pt would like to know if order can be put in again in preparation for her f/u appt with S. Weaver 11/7. Please advise.

## 2024-02-16 NOTE — Telephone Encounter (Signed)
 From 01/10/23 - Dr Alveta OV note: 2.  Mitral regurgitation.  She has mitral regurgitation on exam.  I would like to get an echocardiogram.  She has a high deductible plan so she may elect not to get the echocardiogram at this time.  I have reassured her that the mitral regurgitation sounds mild and I think we can easily wait until she turns 63 before getting an echocardiogram.   Will forward to Glendia Ferrier for review and need for order as pt is scheduled with him 11/7.

## 2024-02-18 NOTE — Telephone Encounter (Signed)
 Echocardiogram order entered. Echocardiogram not likely to be done before appointment. If appt w me is routine follow up, ok to reschedule until after her echocardiogram is done. If she prefers to keep as is, that is ok. Glendia Ferrier, PA-C    02/18/2024 9:02 PM

## 2024-02-20 ENCOUNTER — Ambulatory Visit (HOSPITAL_COMMUNITY)
Admission: RE | Admit: 2024-02-20 | Discharge: 2024-02-20 | Disposition: A | Discharge status: Home / Self Care | Source: Ambulatory Visit | Attending: Cardiology | Admitting: Cardiology

## 2024-02-20 DIAGNOSIS — I340 Nonrheumatic mitral (valve) insufficiency: Secondary | ICD-10-CM | POA: Diagnosis present

## 2024-02-21 LAB — ECHOCARDIOGRAM COMPLETE
Area-P 1/2: 3.74 cm2
S' Lateral: 3.1 cm

## 2024-02-22 ENCOUNTER — Encounter: Payer: Self-pay | Admitting: Physician Assistant

## 2024-02-22 ENCOUNTER — Ambulatory Visit: Payer: Self-pay | Admitting: Physician Assistant

## 2024-02-22 DIAGNOSIS — R011 Cardiac murmur, unspecified: Secondary | ICD-10-CM | POA: Insufficient documentation

## 2024-02-29 NOTE — Progress Notes (Signed)
"   ° ° °  OFFICE NOTE:    Date:  03/01/2024  ID:  Tilton LABOR Pipe, DOB 08-May-1960, MRN [id number] PCP: Nanci Senior, MD  [facility] Providers Cardiologist:  None        Murmur  TTE 02/20/2024: EF 60-65, no RWMA, normal RVSF, normal PASP, trivial AI, mild TR  Bradycardia Monitor 12/2022: NSR, rare PVCs and PACs, no significant arrhythmia, average heart rate 54       Discussed the use of AI scribe software for clinical note transcription with the patient, who gave verbal consent to proceed. History of Present Illness Colleen Orr is a 63 y.o. female for follow up of cardiac murmur. Pt was evaluated by Dr. Alveta in 12/2022 for bradycardia, falls. There was a question of syncope. She is an avid runner, running several marathons. HR was in the 30s at times. There was also a noted murmur that sounded consistent with mitral regurgitation on exam.  She was concerned about insurance coverage and hold off on pursuing echocardiogram.  ZIO monitor demonstrated sinus rhythm, average heart rate 54, rare PACs and PVCs, no significant arrhythmia.  She contacted the office recently and was able to get her echocardiogram scheduled.  This demonstrated EF 60-65, trivial AI and mild TR.  She has stopped running, and her left foot seems to be involved in her previous falls. She has taken balance classes and uses a [employer] for exercise. She also walks her dog regularly. No chest pain or shortness of breath.    ROS-See HPI     Studies Reviewed:  EKG Interpretation Date/Time:  Friday March 01 2024 08:44:50 EST Ventricular Rate:  47 PR Interval:  194 QRS Duration:  90 QT Interval:  480 QTC Calculation: 424 R Axis:   101  Text Interpretation: Sinus bradycardia Rightward axis Incomplete right bundle branch block No significant change since last tracing Confirmed by Lelon Hamilton [phone]) on 03/01/2024 9:00:38 AM             Physical Exam:  VS:  BP 134/82   Pulse (!) 48   Ht 5' 6 (1.676  m)   Wt 130 lb 6.4 oz (59.1 kg)   SpO2 98%   BMI 21.05 kg/m        Wt Readings from Last 3 Encounters:  03/01/24 130 lb 6.4 oz (59.1 kg)  01/10/23 132 lb 6.4 oz (60.1 kg)  08/16/22 127 lb (57.6 kg)    Constitutional:      Appearance: Healthy appearance. Not in distress.  Pulmonary:     Breath sounds: Normal breath sounds. No wheezing. No rales.  Cardiovascular:     Normal rate. Regular rhythm.     Murmurs: There is no murmur.  Edema:    Peripheral edema absent.       Assessment and Plan:    Assessment & Plan Murmur Echocardiogram shows normal heart function with an ejection fraction of 60-65%. Trivial aortic insufficiency and mild tricuspid regurgitation.  No current symptoms of chest pain or dyspnea. No further CV testing needed and she can follow up with Cardiology as needed. Falls She really did not have syncopal episodes. Monitor last year did not show any significant arrhythmias. Suspect neurologic etiology. She plans to pursue MRI. I suggested she may want to see Neurology as well.        Dispo:  Return for for follow up as needed.  Signed, Hamilton Lelon, PA-C   "

## 2024-03-01 ENCOUNTER — Encounter: Payer: Self-pay | Admitting: Physician Assistant

## 2024-03-01 ENCOUNTER — Ambulatory Visit: Attending: Physician Assistant | Admitting: Physician Assistant

## 2024-03-01 VITALS — BP 134/82 | HR 48 | Ht 66.0 in | Wt 130.4 lb

## 2024-03-01 DIAGNOSIS — R011 Cardiac murmur, unspecified: Secondary | ICD-10-CM | POA: Diagnosis present

## 2024-03-01 DIAGNOSIS — R296 Repeated falls: Secondary | ICD-10-CM | POA: Insufficient documentation

## 2024-03-01 NOTE — Patient Instructions (Signed)
 Medication Instructions:  No changes  *If you need a refill on your cardiac medications before your next appointment, please call your pharmacy*   Follow-Up: At [facility], you and your health needs are our priority.  As part of our continuing mission to provide you with exceptional heart care, our providers are all part of one team.  This team includes your primary Cardiologist (physician) and Advanced Practice Providers or APPs (Physician Assistants and Nurse Practitioners) who all work together to provide you with the care you need, when you need it.  Your next appointment:   As needed   Provider:   Glendia Ferrier, PA-C          We recommend signing up for the patient portal called MyChart.  Sign up information is provided on this After Visit Summary.  MyChart is used to connect with patients for Virtual Visits (Telemedicine).  Patients are able to view lab/test results, encounter notes, upcoming appointments, etc.  Non-urgent messages can be sent to your provider as well.   To learn more about what you can do with MyChart, go to [email or web address].   Other Instructions

## 2024-03-01 NOTE — Assessment & Plan Note (Signed)
 Echocardiogram shows normal heart function with an ejection fraction of 60-65%. Trivial aortic insufficiency and mild tricuspid regurgitation.  No current symptoms of chest pain or dyspnea. No further CV testing needed and she can follow up with Cardiology as needed.
